# Patient Record
Sex: Female | Born: 1990 | Race: White | Hispanic: No | Marital: Married | Smoking: Never smoker
Health system: Southern US, Community
[De-identification: ages and names within clinical notes are randomized; demographics above are authoritative.]

## PROBLEM LIST (undated history)

## (undated) DIAGNOSIS — Z8619 Personal history of other infectious and parasitic diseases: Secondary | ICD-10-CM

## (undated) DIAGNOSIS — K219 Gastro-esophageal reflux disease without esophagitis: Secondary | ICD-10-CM

## (undated) HISTORY — PX: MANDIBLE SURGERY: SHX707

## (undated) HISTORY — DX: Gastro-esophageal reflux disease without esophagitis: K21.9

## (undated) HISTORY — DX: Personal history of other infectious and parasitic diseases: Z86.19

---

## 2018-04-29 DIAGNOSIS — R7303 Prediabetes: Secondary | ICD-10-CM | POA: Insufficient documentation

## 2018-04-29 DIAGNOSIS — E282 Polycystic ovarian syndrome: Secondary | ICD-10-CM | POA: Insufficient documentation

## 2019-01-07 ENCOUNTER — Encounter: Payer: Self-pay | Admitting: Physician Assistant

## 2019-01-07 ENCOUNTER — Other Ambulatory Visit: Payer: Self-pay

## 2019-01-07 ENCOUNTER — Ambulatory Visit (INDEPENDENT_AMBULATORY_CARE_PROVIDER_SITE_OTHER): Payer: BLUE CROSS/BLUE SHIELD | Admitting: Physician Assistant

## 2019-01-07 DIAGNOSIS — E282 Polycystic ovarian syndrome: Secondary | ICD-10-CM

## 2019-01-07 DIAGNOSIS — M545 Low back pain, unspecified: Secondary | ICD-10-CM

## 2019-01-07 DIAGNOSIS — K219 Gastro-esophageal reflux disease without esophagitis: Secondary | ICD-10-CM | POA: Insufficient documentation

## 2019-01-07 DIAGNOSIS — N926 Irregular menstruation, unspecified: Secondary | ICD-10-CM | POA: Insufficient documentation

## 2019-01-07 DIAGNOSIS — Z975 Presence of (intrauterine) contraceptive device: Secondary | ICD-10-CM | POA: Insufficient documentation

## 2019-01-07 MED ORDER — CYCLOBENZAPRINE HCL 10 MG PO TABS: 10.0000 mg | ORAL_TABLET | Freq: Every day | ORAL | 0 refills | Status: DC

## 2019-01-07 MED ORDER — MELOXICAM 15 MG PO TABS: 15.0000 mg | ORAL_TABLET | Freq: Every day | ORAL | 0 refills | Status: DC

## 2019-01-07 NOTE — Progress Notes (Signed)
   Virtual Visit via Video   I connected with patient on 01/07/19 at  2:00 PM EDT by a video enabled telemedicine application and verified that I am speaking with the correct person using two identifiers.  Location patient: Home Location provider: [facility], Office Persons participating in the virtual visit: Patient, Provider, [place] (Patina Moore)  I discussed the limitations of evaluation and management by telemedicine and the availability of in person appointments. The patient expressed understanding and agreed to proceed.  Subjective:   HPI:   Patient presents today via Doxy.Me today to establish care.   History of PCOS, currently followed by [facility]. Is not currently on Metofrmin. Notes he was taking before but no change in A1C and had side effects to this was stopped.Marland Kitchen   GERD -- OTC H2 blocker PRN for occasional symptoms. Avoid trigger foods. Denies abdominal pain, nausea or vomiting. Denies change in bowel habits.  Patient endorses over the past 4-5 weeks having bilateral lumbar back pain described as an aching and tightness. R?L. Denies radiation of pain. Denies trauma or injury. Denies numbness, tingling or weakness of extremities. Denies saddle paresthesias. ROM worsens. Laying improves. Prolonged sitting exacerbates. Takes Ibuprofen here and there with some improvement. Massage helps. Patient with morbid obesity.  ROS:  Review of Systems  Constitutional: Negative for fever and weight loss.  Cardiovascular: Negative for chest pain and palpitations.  Gastrointestinal: Positive for heartburn. Negative for abdominal pain, blood in stool, constipation, diarrhea, melena, nausea and vomiting.  Genitourinary: Negative.   Musculoskeletal: Positive for back pain. Negative for falls, myalgias and neck pain.  Neurological: Negative for dizziness, loss of consciousness and headaches.  Psychiatric/Behavioral: Negative for depression. The patient is not  nervous/anxious.      Patient Active Problem List   Diagnosis Date Noted  . IUD contraception 01/07/2019  . Irregular periods 01/07/2019  . Polycystic ovary syndrome 04/29/2018  . Prediabetes 04/29/2018    Social History   Tobacco Use  . Smoking status: Never Smoker  . Smokeless tobacco: Never Used  Substance Use Topics  . Alcohol use: Not Currently    Current Outpatient Medications:  .  amoxicillin-clavulanate (AUGMENTIN) 875-125 MG tablet, , Disp: , Rfl:  .  cimetidine (TAGAMET) 200 MG tablet, Take 200 mg by mouth 2 (two) times daily., Disp: , Rfl:  .  fluconazole (DIFLUCAN) 150 MG tablet, , Disp: , Rfl:  .  levonorgestrel (MIRENA, 52 MG,) 20 MCG/24HR IUD, Mirena 20 mcg/24 hours (5 yrs) 52 mg intrauterine device, Disp: , Rfl:   Allergies  Allergen Reactions  . Latex Itching    Objective:   There were no vitals taken for this visit.  Patient is well-developed, well-nourished in no acute distress.  Resting comfortably in chair at home.  Head is normocephalic, atraumatic.  No labored breathing.  Speech is clear and coherent with logical contest.  Patient is alert and oriented at baseline.   Assessment and Plan:   1. Acute bilateral low back pain without sciatica Atraumatic. No alarm signs/symptoms. Start Flexeril at night. Rx Meloxicam once daily with food. Tylenol for breakthrough pain. Continue massages. If not improving over the next week will proceed with imaging and in-office assessment.   2. Polycystic ovary syndrome Continue Management per GYN. Is to schedule CPE with fasting labs so A1C can be reassessed.   3. Gastroesophageal reflux disease without esophagitis Occasional symptoms. Controlled with PRN H2 blocker. GERD diet reviewed. Will monitor.   Leeanne Rio, PA-C 01/07/2019

## 2019-01-07 NOTE — Progress Notes (Signed)
I have discussed the procedure for the virtual visit with the patient who has given consent to proceed with assessment and treatment.   Tyreese Thain S Abbegayle Denault, CMA     

## 2019-01-09 ENCOUNTER — Encounter: Payer: Self-pay | Admitting: Physician Assistant

## 2019-01-09 MED ORDER — FLUCONAZOLE 150 MG PO TABS: 150.0000 mg | ORAL_TABLET | Freq: Once | ORAL | 0 refills | Status: AC

## 2019-01-20 ENCOUNTER — Encounter: Payer: Self-pay | Admitting: Physician Assistant

## 2019-03-17 ENCOUNTER — Encounter: Payer: Self-pay | Admitting: Physician Assistant

## 2019-03-17 ENCOUNTER — Ambulatory Visit (INDEPENDENT_AMBULATORY_CARE_PROVIDER_SITE_OTHER): Payer: BLUE CROSS/BLUE SHIELD

## 2019-03-17 ENCOUNTER — Other Ambulatory Visit: Payer: Self-pay

## 2019-03-17 ENCOUNTER — Other Ambulatory Visit: Payer: BLUE CROSS/BLUE SHIELD

## 2019-03-17 ENCOUNTER — Ambulatory Visit: Payer: BLUE CROSS/BLUE SHIELD | Admitting: Physician Assistant

## 2019-03-17 VITALS — BP 124/84 | HR 83 | Temp 98.1°F | Resp 16 | Ht 66.0 in | Wt 265.0 lb

## 2019-03-17 DIAGNOSIS — Z23 Encounter for immunization: Secondary | ICD-10-CM | POA: Diagnosis not present

## 2019-03-17 DIAGNOSIS — G8929 Other chronic pain: Secondary | ICD-10-CM

## 2019-03-17 DIAGNOSIS — M545 Low back pain, unspecified: Secondary | ICD-10-CM

## 2019-03-17 DIAGNOSIS — M109 Gout, unspecified: Secondary | ICD-10-CM | POA: Diagnosis not present

## 2019-03-17 LAB — URIC ACID: Uric Acid, Serum: 7.5 mg/dL — ABNORMAL HIGH (ref 2.4–7.0)

## 2019-03-17 NOTE — Progress Notes (Signed)
Patient presents to clinic today to discuss 2 issues.  Patient endorses pain, redness and swelling in her L great toe, lasting for 3-4 days before subsiding. Denies trauma or injury to foot. Denied numbness or tingling. Notes pain with trying to walk on the toe. Denies decreased ROM. Denies personal history of gout but notes her father has had significant issue with this and told her that her toe looked like his get during a flare. Denies alcohol use. Does have heavy purine diet.   Patient also with intermittent bilateral low back pain starting in 2016. Atraumatic. Saw chiropractor at the time which gave her some relief. Notes a few years without symptoms but since March has been having flare ups. Rare radiation of pain down her left leg. None currently. Pain worse with prolonged sitting. Denies saddle anesthesia, change to bowel or bladder habits. Was seen at her initial visit for acute low back pain, givien Meloxicam and Flexeril with resolution in symptoms but they keep recurring. Denies ever having imaging of her lower back.  Past Medical History:  Diagnosis Date  . GERD (gastroesophageal reflux disease)   . History of chickenpox     Current Outpatient Medications on File Prior to Visit  Medication Sig Dispense Refill  . cetirizine (ZYRTEC) 10 MG tablet Take 10 mg by mouth daily.    . cyclobenzaprine (FLEXERIL) 10 MG tablet Take 1 tablet (10 mg total) by mouth at bedtime. 15 tablet 0  . levonorgestrel (MIRENA, 52 MG,) 20 MCG/24HR IUD Mirena 20 mcg/24 hours (5 yrs) 52 mg intrauterine device    . meloxicam (MOBIC) 15 MG tablet Take 1 tablet (15 mg total) by mouth daily. 20 tablet 0  . pantoprazole (PROTONIX) 40 MG tablet Take 40 mg by mouth daily.     No current facility-administered medications on file prior to visit.     Allergies  Allergen Reactions  . Latex Itching    Family History  Problem Relation Age of Onset  . Fibromyalgia Mother   . Arthritis Mother   . Depression  Mother   . Anxiety disorder Mother   . Hyperlipidemia Father   . Mental illness Sister   . Osteoporosis Maternal Grandmother   . Heart disease Paternal Grandfather   . Heart attack Paternal Grandfather   . Lung cancer Maternal Aunt     Social History   Socioeconomic History  . Marital status: Married    Spouse name: Not on file  . Number of children: Not on file  . Years of education: Not on file  . Highest education level: Not on file  Occupational History  . Not on file  Social Needs  . Financial resource strain: Not on file  . Food insecurity    Worry: Not on file    Inability: Not on file  . Transportation needs    Medical: Not on file    Non-medical: Not on file  Tobacco Use  . Smoking status: Never Smoker  . Smokeless tobacco: Never Used  Substance and Sexual Activity  . Alcohol use: Not Currently  . Drug use: Not Currently  . Sexual activity: Yes    Birth control/protection: I.U.D.  Lifestyle  . Physical activity    Days per week: Not on file    Minutes per session: Not on file  . Stress: Not on file  Relationships  . Social Herbalist on phone: Not on file    Gets together: Not on file    Attends  religious service: Not on file    Active member of club or organization: Not on file    Attends meetings of clubs or organizations: Not on file    Relationship status: Not on file  Other Topics Concern  . Not on file  Social History Narrative  . Not on file   Review of Systems - See HPI.  All other ROS are negative.  BP 124/84   Pulse 83   Temp 98.1 F (36.7 C) (Skin)   Resp 16   Ht 5\' 6"  (1.676 m)   Wt 265 lb (120.2 kg)   SpO2 98%   BMI 42.77 kg/m   Physical Exam Vitals signs reviewed.  Constitutional:      Appearance: Normal appearance.  HENT:     Head: Normocephalic and atraumatic.  Neck:     Musculoskeletal: Neck supple.  Cardiovascular:     Rate and Rhythm: Normal rate and regular rhythm.     Pulses: Normal pulses.     Heart  sounds: Normal heart sounds.  Pulmonary:     Effort: Pulmonary effort is normal.  Musculoskeletal:     Right hip: Normal.     Left hip: Normal.     Thoracic back: Normal.     Lumbar back: Normal.       Feet:  Neurological:     General: No focal deficit present.     Mental Status: She is alert and oriented to person, place, and time.  Psychiatric:        Mood and Affect: Mood normal.     Assessment/Plan: 1. Podagra Resolved but giving history given, concern for gout. Dietary precautions reviewed with patient. Handout given. Will check uric acid level today. - Uric acid  2. Chronic bilateral low back pain without sciatica Ongoing and intermittent but increasing in frequency. Exam unremarkable today. Stretching reviewed. Will check x-ray lumbar spine to further assess may concern PT versus further imaging. Discussed diet for weight loss to help with this.  - DG Lumbar Spine Complete; Future  3. Need for immunization against influenza - Flu Vaccine QUAD 36+ mos IM   , PA-C

## 2019-03-17 NOTE — Patient Instructions (Signed)
Please speak with [name] up front to schedule an appointment to get your x-ray.  Please go to the [facility] office for x-ray. We will call you with your results and alter treatment accordingly.   Joanna Lindsey  [name], [place] [address]  I have refilled the meloxicam and flexeril to have on hand if needed for now.  Once we get results of imaging we will determine next steps.  For the suspected gout, please follow dietary recommendations below. We are checking uric acid levels today.    Gout  Gout is painful swelling of your joints. Gout is a type of arthritis. It is caused by having too much uric acid in your body. Uric acid is a chemical that is made when your body breaks down substances called purines. If your body has too much uric acid, sharp crystals can form and build up in your joints. This causes pain and swelling. Gout attacks can happen quickly and be very painful (acute gout). Over time, the attacks can affect more joints and happen more often (chronic gout). What are the causes?  Too much uric acid in your blood. This can happen because: ? Your kidneys do not remove enough uric acid from your blood. ? Your body makes too much uric acid. ? You eat too many foods that are high in purines. These foods include organ meats, some seafood, and beer.  Trauma or stress. What increases the risk?  Having a family history of gout.  Being female and middle-aged.  Being female and having gone through menopause.  Being very overweight (obese).  Drinking alcohol, especially beer.  Not having enough water in the body (being dehydrated).  Losing weight too quickly.  Having an organ transplant.  Having lead poisoning.  Taking certain medicines.  Having kidney disease.  Having a skin condition called psoriasis. What are the signs or symptoms? An attack of acute gout usually happens in just one joint. The most common place is the big toe.  Attacks often start at night. Other joints that may be affected include joints of the feet, ankle, knee, fingers, wrist, or elbow. Symptoms of an attack may include:  Very bad pain.  Warmth.  Swelling.  Stiffness.  Shiny, red, or purple skin.  Tenderness. The affected joint may be very painful to touch.  Chills and fever. Chronic gout may cause symptoms more often. More joints may be involved. You may also have white or yellow lumps (tophi) on your hands or feet or in other areas near your joints. How is this treated?  Treatment for this condition has two phases: treating an acute attack and preventing future attacks.  Acute gout treatment may include: ? NSAIDs. ? Steroids. These are taken by mouth or injected into a joint. ? Colchicine. This medicine relieves pain and swelling. It can be given by mouth or through an IV tube.  Preventive treatment may include: ? Taking small doses of NSAIDs or colchicine daily. ? Using a medicine that reduces uric acid levels in your blood. ? Making changes to your diet. You may need to see a food expert (dietitian) about what to eat and drink to prevent gout. Follow these instructions at home: During a gout attack   If told, put ice on the painful area: ? Put ice in a plastic bag. ? Place a towel between your skin and the bag. ? Leave the ice on for 20 minutes, 2-3 times a day.  Raise (elevate) the painful  joint above the level of your heart as often as you can.  Rest the joint as much as possible. If the joint is in your leg, you may be given crutches.  Follow instructions from your doctor about what you cannot eat or drink. Avoiding future gout attacks  Eat a low-purine diet. Avoid foods and drinks such as: ? Liver. ? Kidney. ? Anchovies. ? Asparagus. ? Herring. ? Mushrooms. ? Mussels. ? Beer.  Stay at a healthy weight. If you want to lose weight, talk with your doctor. Do not lose weight too fast.  Start or continue an  exercise plan as told by your doctor. Eating and drinking  Drink enough fluids to keep your pee (urine) pale yellow.  If you drink alcohol: ? Limit how much you use to:  0-1 drink a day for women.  0-2 drinks a day for men. ? Be aware of how much alcohol is in your drink. In the [place] one drink equals one 12 oz bottle of beer (355 mL), one 5 oz glass of wine (148 mL), or one 1 oz glass of hard liquor (44 mL). General instructions  Take over-the-counter and prescription medicines only as told by your doctor.  Do not drive or use heavy machinery while taking prescription pain medicine.  Return to your normal activities as told by your doctor. Ask your doctor what activities are safe for you.  Keep all follow-up visits as told by your doctor. This is important. Contact a doctor if:  You have another gout attack.  You still have symptoms of a gout attack after 10 days of treatment.  You have problems (side effects) because of your medicines.  You have chills or a fever.  You have burning pain when you pee (urinate).  You have pain in your lower back or belly. Get help right away if:  You have very bad pain.  Your pain cannot be controlled.  You cannot pee. Summary  Gout is painful swelling of the joints.  The most common site of pain is the big toe, but it can affect other joints.  Medicines and avoiding some foods can help to prevent and treat gout attacks. This information is not intended to replace advice given to you by your health care provider. Make sure you discuss any questions you have with your health care provider. Document Released: 03/21/2008 Document Revised: 01/02/2018 Document Reviewed: 01/02/2018 Elsevier Patient Education  2020 [employer].

## 2019-03-18 ENCOUNTER — Other Ambulatory Visit: Payer: Self-pay | Admitting: Emergency Medicine

## 2019-03-18 DIAGNOSIS — G8929 Other chronic pain: Secondary | ICD-10-CM

## 2019-03-19 ENCOUNTER — Other Ambulatory Visit: Payer: Self-pay

## 2019-03-19 DIAGNOSIS — E790 Hyperuricemia without signs of inflammatory arthritis and tophaceous disease: Secondary | ICD-10-CM

## 2019-03-25 ENCOUNTER — Other Ambulatory Visit: Payer: Self-pay

## 2019-03-25 ENCOUNTER — Ambulatory Visit (INDEPENDENT_AMBULATORY_CARE_PROVIDER_SITE_OTHER): Payer: BLUE CROSS/BLUE SHIELD | Admitting: Physical Therapy

## 2019-03-25 ENCOUNTER — Encounter: Payer: Self-pay | Admitting: Physical Therapy

## 2019-03-25 ENCOUNTER — Other Ambulatory Visit: Payer: Self-pay | Admitting: Physician Assistant

## 2019-03-25 DIAGNOSIS — M545 Low back pain, unspecified: Secondary | ICD-10-CM

## 2019-03-25 DIAGNOSIS — G8929 Other chronic pain: Secondary | ICD-10-CM

## 2019-03-25 DIAGNOSIS — M6281 Muscle weakness (generalized): Secondary | ICD-10-CM

## 2019-03-25 NOTE — Patient Instructions (Signed)
Access Code: [email or web address]  URL: [email or web address]  Date: 03/25/2019  Prepared by: Lyndee Hensen   Exercises Single Knee to Chest Stretch - 3 reps - 30 hold - 2x daily Supine Figure 4 Piriformis Stretch - 3 reps - 30 hold - 2x daily Supine Posterior Pelvic Tilt - 10 reps - 2 sets - 2x daily Seated Correct Posture

## 2019-03-26 ENCOUNTER — Encounter: Payer: Self-pay | Admitting: Physical Therapy

## 2019-03-26 ENCOUNTER — Other Ambulatory Visit: Payer: Self-pay | Admitting: Physician Assistant

## 2019-03-26 ENCOUNTER — Encounter: Payer: Self-pay | Admitting: Physician Assistant

## 2019-03-26 DIAGNOSIS — M545 Low back pain, unspecified: Secondary | ICD-10-CM

## 2019-03-26 NOTE — Therapy (Signed)
[facility] [address] [place], [place], [address] Phone: [phone]   Fax:  [phone]  Physical Therapy Evaluation  Patient Details  Name: Joanna [name] MRN: [id number] Date of Birth: November 25, 1990 Referring Provider (PT): Marcelline Mates PA   Encounter Date: 03/25/2019  PT End of Session - 03/26/19 1039    Visit Number  1    Number of Visits  12    Date for PT Re-Evaluation  05/06/19    Authorization Type  [employer]    PT Start Time  1105    PT Stop Time  1145    PT Time Calculation (min)  40 min    Activity Tolerance  Patient tolerated treatment well    Behavior During Therapy  [facility] for tasks assessed/performed       Past Medical History:  Diagnosis Date  . GERD (gastroesophageal reflux disease)   . History of chickenpox     Past Surgical History:  Procedure Laterality Date  . MANDIBLE SURGERY      There were no vitals filed for this visit.   Subjective Assessment - 03/25/19 1110    Subjective  Pt states start of back pain in 2016, from sitting alot in grad school.  More recently in July pain increased.  Most pain with sitting and with prolonged standing, No radicular pain.    Limitations  Sitting;Standing;House hold activities    Patient Stated Goals  Decreased pain,    Currently in Pain?  Yes    Pain Score  8     Pain Location  Back    Pain Orientation  Right;Left    Pain Descriptors / Indicators  Aching    Pain Type  Chronic pain    Pain Radiating Towards  overall 4, up to 8/10 with sitting.    Pain Onset  More than a month ago    Pain Frequency  Intermittent    Aggravating Factors   sitting, pronged standing    Pain Relieving Factors  rest , laying on stomach.         [facility] PT Assessment - 03/26/19 0001      Assessment   Medical Diagnosis  [place] Low Back Pain    Referring Provider (PT)  Marcelline Mates PA    Prior Therapy  NO      Balance Screen   Has the patient fallen in the past 6 months  No       Prior Function   Level of Independence  Independent      Cognition   Overall Cognitive Status  Within Functional Limits for tasks assessed      Posture/Postural Control   Posture Comments  flattened thoracic and lumbar spine      ROM / Strength   AROM / PROM / Strength  AROM;Strength      AROM   Overall AROM Comments  Lumbar: WNL      Strength   Overall Strength Comments  Hips: 4/5, Core: 3/5, moderate/significant postural changes seen with bridge.       Palpation   Palpation comment  Pain at B SI, mild pain into L glute, mild soreness in central lumbar spine with PA mobs,       Special Tests   Other special tests  Neg SLR, No pain with lumbar flexion repeated motions,                 Objective measurements completed on examination: See above findings.  [facility] PT Treatment/Exercise - 03/26/19 0001      Self-Care   Self-Care  Posture    Posture  Education on proper seated posture and neutral pelvic/lumbar alignment.       Exercises   Exercises  Lumbar      Lumbar Exercises: Stretches   Single Knee to Chest Stretch  3 reps;20 seconds;Right;Left    Pelvic Tilt  20 reps    Piriformis Stretch  2 reps;30 seconds;Right;Left    Piriformis Stretch Limitations  supine             PT Education - 03/26/19 1039    Education Details  PT POC, initial HEP    Person(s) Educated  Patient    Methods  Explanation;Demonstration;Tactile cues;Verbal cues;Handout    Comprehension  Verbalized understanding;Returned demonstration;Verbal cues required;Tactile cues required;Need further instruction       PT Short Term Goals - 03/26/19 1042      PT SHORT TERM GOAL #1   Title  Pt to be independent with initial HEP    Time  2    Period  Weeks    Status  New    Target Date  04/08/19      PT SHORT TERM GOAL #2   Title  Pt to demo ability to achieve optimal upright, seated posture.    Time  2    Period  Weeks    Status  New    Target Date  04/08/19         PT Long Term Goals - 03/26/19 1046      PT LONG TERM GOAL #1   Title  Pt to be independent with final HEP    Time  6    Period  Weeks    Status  New    Target Date  05/06/19      PT LONG TERM GOAL #2   Title  Pt to demo improved strength of core and hips, to at least 4+/5, with minimal/no postural changes seen with ther ex. , to improve stability and pain.    Time  6    Period  Weeks    Status  New    Target Date  05/06/19      PT LONG TERM GOAL #3   Title  Pt to report ability for sitting up to 30 min without pain, to improve ability for work duties.    Time  6    Period  Weeks    Status  New    Target Date  05/06/19      PT LONG TERM GOAL #4   Title  Pt to report ability for walking program, for at least .5 mile, 2-3 x/wk for improved physical activity, weight loss, and back pain.    Time  6    Period  Weeks    Status  New    Target Date  05/06/19             Plan - 03/26/19 1057    Clinical Impression Statement  Pt presents with primary complaint of increased pain in low back. Pt with most pain in seated, but has good lumbar flexion and ROM. She has decreased seated posture/alignment, and will benefit from education on this. Pt with decreased core strength, and overall deconditioned. Pain likely stemming from poor posture and core weakness. Pt with decreased ability for full functional activities , work duties and community activities due to pain. Pt to benefit from skiled PT to improve pain  and deficits.    Examination-Activity Limitations  Sit;Sleep;Stand;Locomotion Level    Examination-Participation Restrictions  Cleaning;Shop;Community Activity;Driving;Laundry    Stability/Clinical Decision Making  Stable/Uncomplicated    Clinical Decision Making  Low    Rehab Potential  Good    PT Frequency  2x / week    PT Duration  6 weeks    PT Treatment/Interventions  ADLs/Self Care Home Management;Cryotherapy;Electrical Stimulation;Iontophoresis 4mg /ml  Dexamethasone;Moist Heat;Therapeutic exercise;Therapeutic activities;Functional mobility training;Ultrasound;Neuromuscular re-education;Patient/family education;Orthotic Fit/Training;Manual techniques;Taping;Dry needling;Passive range of motion;Spinal Manipulations    PT Next Visit Plan  1-2x/wk for 6 weeks.    Consulted and Agree with Plan of Care  Patient       Patient will benefit from skilled therapeutic intervention in order to improve the following deficits and impairments:  Decreased endurance, Pain, Decreased strength, Decreased activity tolerance, Decreased mobility, Increased muscle spasms, Decreased range of motion, Improper body mechanics, Postural dysfunction  Visit Diagnosis: Chronic bilateral low back pain without sciatica  Muscle weakness (generalized)     Problem List Patient Active Problem List   Diagnosis Date Noted  . IUD contraception 01/07/2019  . Irregular periods 01/07/2019  . Gastroesophageal reflux disease without esophagitis 01/07/2019  . Polycystic ovary syndrome 04/29/2018  . Prediabetes 04/29/2018    Sedalia MutaLauren Adelard Sanon, PT, DPT 11:01 AM  03/26/19    [facility] [address] [place], [place], [address] Phone: [phone]   Fax:  [phone]  Name: Joanna [name] MRN: [id number] Date of Birth: April 23, 1991

## 2019-03-27 ENCOUNTER — Encounter: Payer: Self-pay | Admitting: Physician Assistant

## 2019-03-27 ENCOUNTER — Other Ambulatory Visit: Payer: Self-pay | Admitting: Physician Assistant

## 2019-03-27 DIAGNOSIS — M545 Low back pain, unspecified: Secondary | ICD-10-CM

## 2019-03-27 MED ORDER — MELOXICAM 15 MG PO TABS: 15.0000 mg | ORAL_TABLET | Freq: Every day | ORAL | 0 refills | Status: DC

## 2019-03-31 ENCOUNTER — Ambulatory Visit (INDEPENDENT_AMBULATORY_CARE_PROVIDER_SITE_OTHER): Payer: BLUE CROSS/BLUE SHIELD | Admitting: Physical Therapy

## 2019-03-31 ENCOUNTER — Other Ambulatory Visit: Payer: Self-pay

## 2019-03-31 ENCOUNTER — Encounter: Payer: Self-pay | Admitting: Physical Therapy

## 2019-03-31 DIAGNOSIS — M545 Low back pain, unspecified: Secondary | ICD-10-CM

## 2019-03-31 DIAGNOSIS — G8929 Other chronic pain: Secondary | ICD-10-CM | POA: Diagnosis not present

## 2019-03-31 DIAGNOSIS — M6281 Muscle weakness (generalized): Secondary | ICD-10-CM | POA: Diagnosis not present

## 2019-03-31 NOTE — Therapy (Signed)
[facility] [address] [place], [place], [address] Phone: [phone]   Fax:  [phone]  Physical Therapy Treatment  Patient Details  Name: Joanna [name] MRN: [id number] Date of Birth: 1990/08/06 Referring Provider (PT): Marcelline Mates PA   Encounter Date: 03/31/2019  PT End of Session - 03/31/19 1023    Visit Number  2    Number of Visits  12    Date for PT Re-Evaluation  05/06/19    Authorization Type  BCBS    PT Start Time  1018    PT Stop Time  1057    PT Time Calculation (min)  39 min    Activity Tolerance  Patient tolerated treatment well    Behavior During Therapy  [facility] for tasks assessed/performed       Past Medical History:  Diagnosis Date  . GERD (gastroesophageal reflux disease)   . History of chickenpox     Past Surgical History:  Procedure Laterality Date  . MANDIBLE SURGERY      There were no vitals filed for this visit.  Subjective Assessment - 03/31/19 1022    Subjective  Pt states mild pain today. Was able to do HEP, has questions about HEP.    Patient Stated Goals  Decreased pain,    Currently in Pain?  Yes    Pain Score  4     Pain Location  Back    Pain Orientation  Right;Left    Pain Descriptors / Indicators  Aching    Pain Type  Chronic pain    Pain Onset  More than a month ago    Pain Frequency  Intermittent                       [facility] Adult PT Treatment/Exercise - 03/31/19 1023      Posture/Postural Control   Posture Comments  flattened thoracic and lumbar spine      Self-Care   Self-Care  Posture    Posture  Education on proper seated posture and neutral pelvic/lumbar alignment.       Exercises   Exercises  Lumbar      Lumbar Exercises: Stretches   Single Knee to Chest Stretch  3 reps;20 seconds;Right;Left    Single Knee to Chest Stretch Limitations  with towel     Pelvic Tilt  20 reps    Piriformis Stretch  2 reps;30 seconds;Right;Left    Piriformis  Stretch Limitations  seated and supine      Lumbar Exercises: Aerobic   Stationary Bike  L1 x 5 min       Lumbar Exercises: Standing   Row  20 reps    Theraband Level (Row)  Level 3 (Green)    Other Standing Lumbar Exercises  HIp abd 2x10 bil;      Lumbar Exercises: Supine   Ab Set  15 reps    Bent Knee Raise  15 reps    Bridge  10 reps    Straight Leg Raise  10 reps    Straight Leg Raises Limitations  bil with TA             PT Education - 03/31/19 1022    Education Details  HEP reviewed, updated    Person(s) Educated  Patient    Methods  Explanation;Handout;Demonstration;Tactile cues;Verbal cues    Comprehension  Verbalized understanding;Returned demonstration;Verbal cues required;Tactile cues required;Need further instruction       PT Short Term Goals -  03/26/19 1042      PT SHORT TERM GOAL #1   Title  Pt to be independent with initial HEP    Time  2    Period  Weeks    Status  New    Target Date  04/08/19      PT SHORT TERM GOAL #2   Title  Pt to demo ability to achieve optimal upright, seated posture.    Time  2    Period  Weeks    Status  New    Target Date  04/08/19        PT Long Term Goals - 03/26/19 1046      PT LONG TERM GOAL #1   Title  Pt to be independent with final HEP    Time  6    Period  Weeks    Status  New    Target Date  05/06/19      PT LONG TERM GOAL #2   Title  Pt to demo improved strength of core and hips, to at least 4+/5, with minimal/no postural changes seen with ther ex. , to improve stability and pain.    Time  6    Period  Weeks    Status  New    Target Date  05/06/19      PT LONG TERM GOAL #3   Title  Pt to report ability for sitting up to 30 min without pain, to improve ability for work duties.    Time  6    Period  Weeks    Status  New    Target Date  05/06/19      PT LONG TERM GOAL #4   Title  Pt to report ability for walking program, for at least .5 mile, 2-3 x/wk for improved physical activity, weight  loss, and back pain.    Time  6    Period  Weeks    Status  New    Target Date  05/06/19            Plan - 03/31/19 1058    Clinical Impression Statement  Education and practice on ther ex for stretching and strengthening today, Pt req cuing for core activation, able to achieve contraction. Pt to benefit from continued education and practice on stabilization.    Examination-Activity Limitations  Sit;Sleep;Stand;Locomotion Level    Examination-Participation Restrictions  Cleaning;Shop;Community Activity;Driving;Laundry    Stability/Clinical Decision Making  Stable/Uncomplicated    Rehab Potential  Good    PT Frequency  2x / week    PT Duration  6 weeks    PT Treatment/Interventions  ADLs/Self Care Home Management;Cryotherapy;Electrical Stimulation;Iontophoresis 4mg /ml Dexamethasone;Moist Heat;Therapeutic exercise;Therapeutic activities;Functional mobility training;Ultrasound;Neuromuscular re-education;Patient/family education;Orthotic Fit/Training;Manual techniques;Taping;Dry needling;Passive range of motion;Spinal Manipulations    PT Next Visit Plan  1-2x/wk for 6 weeks.    Consulted and Agree with Plan of Care  Patient       Patient will benefit from skilled therapeutic intervention in order to improve the following deficits and impairments:  Decreased endurance, Pain, Decreased strength, Decreased activity tolerance, Decreased mobility, Increased muscle spasms, Decreased range of motion, Improper body mechanics, Postural dysfunction  Visit Diagnosis: Acute bilateral low back pain without sciatica  Chronic bilateral low back pain without sciatica  Muscle weakness (generalized)     Problem List Patient Active Problem List   Diagnosis Date Noted  . IUD contraception 01/07/2019  . Irregular periods 01/07/2019  . Gastroesophageal reflux disease without esophagitis 01/07/2019  . Polycystic ovary syndrome  04/29/2018  . Prediabetes 04/29/2018    Lyndee Hensen, PT,  DPT 10:59 AM  03/31/19    [facility] [place], [place], [address] Phone: [phone]   Fax:  [phone]  Name: CRISTYN CROSSNO MRN: [id number] Date of Birth: Jul 10, 1990

## 2019-03-31 NOTE — Patient Instructions (Signed)
Access Code: [email or web address]  URL: [email or web address]  Date: 03/31/2019  Prepared by: Lyndee Hensen   Exercises Single Knee to Chest Stretch - 3 reps - 30 hold - 2x daily Supine Figure 4 Piriformis Stretch - 3 reps - 30 hold - 2x daily Supine Lower Trunk Rotation - 10 reps - 1 sets - 5 hold - 1x daily Supine Posterior Pelvic Tilt - 10 reps - 2 sets - 2x daily Seated Piriformis Stretch with Trunk Bend - 3 reps - 30 hold - 2x daily Seated Correct Posture Supine Transversus Abdominis Bracing - Hands on Ground - 10 reps - 1 sets - 2x daily Supine March - 10 reps - 2 sets - 1x daily Straight Leg Raise - 10 reps - 1-2 sets - 1-2x daily Supine Bridge - 10 reps - 1-2 sets - 1-2x daily

## 2019-04-03 ENCOUNTER — Encounter: Payer: Self-pay | Admitting: Physical Therapy

## 2019-04-03 ENCOUNTER — Ambulatory Visit (INDEPENDENT_AMBULATORY_CARE_PROVIDER_SITE_OTHER): Payer: BLUE CROSS/BLUE SHIELD | Admitting: Physical Therapy

## 2019-04-03 ENCOUNTER — Other Ambulatory Visit: Payer: Self-pay

## 2019-04-03 DIAGNOSIS — M6281 Muscle weakness (generalized): Secondary | ICD-10-CM

## 2019-04-03 DIAGNOSIS — M545 Low back pain, unspecified: Secondary | ICD-10-CM

## 2019-04-03 DIAGNOSIS — G8929 Other chronic pain: Secondary | ICD-10-CM

## 2019-04-03 NOTE — Therapy (Signed)
[facility] [address] [place], [place], [address] Phone: [phone]   Fax:  [phone]  Physical Therapy Treatment  Patient Details  Name: Joanna [name] MRN: [id number] Date of Birth: 1991/05/12 Referring Provider (PT): Raiford Noble PA   Encounter Date: 04/03/2019  PT End of Session - 04/03/19 1115    Visit Number  3    Number of Visits  12    Date for PT Re-Evaluation  05/06/19    Authorization Type  BCBS    PT Start Time  1103    PT Stop Time  1144    PT Time Calculation (min)  41 min    Activity Tolerance  Patient tolerated treatment well    Behavior During Therapy  [facility] for tasks assessed/performed       Past Medical History:  Diagnosis Date  . GERD (gastroesophageal reflux disease)   . History of chickenpox     Past Surgical History:  Procedure Laterality Date  . MANDIBLE SURGERY      There were no vitals filed for this visit.  Subjective Assessment - 04/03/19 1114    Subjective  Pt states increased pain when riding 2 hrs in car earlier this week.Pain better today. She did get a lumbar roll for car yesterday .    Currently in Pain?  Yes    Pain Score  3     Pain Location  Back    Pain Orientation  Right;Left    Pain Descriptors / Indicators  Aching                       [facility] Adult PT Treatment/Exercise - 04/03/19 1102      Posture/Postural Control   Posture Comments  flattened thoracic and lumbar spine      Self-Care   Self-Care  Posture    Posture  Education on proper seated posture and neutral pelvic/lumbar alignment.       Exercises   Exercises  Lumbar      Lumbar Exercises: Stretches   Single Knee to Chest Stretch  --    Single Knee to Chest Stretch Limitations  --    Pelvic Tilt  20 reps    Piriformis Stretch  2 reps;30 seconds;Right;Left    Piriformis Stretch Limitations  supine    Other Lumbar Stretch Exercise  childs pose L/R /Center x1 each;       Lumbar  Exercises: Aerobic   Stationary Bike  L1 x 7 min       Lumbar Exercises: Standing   Functional Squats  10 reps    Functional Squats Limitations  with education and practice for correct lifting mechanics for IADLs.     Row  20 reps    Theraband Level (Row)  Level 3 (Green)    Other Standing Lumbar Exercises  HIp abd 2x10 bil;      Lumbar Exercises: Supine   Ab Set  --    Bent Knee Raise  20 reps    Bridge  20 reps    Straight Leg Raise  10 reps    Straight Leg Raises Limitations  bil with TA    Other Supine Lumbar Exercises  Clam/Alt with TA and GTB       Manual Therapy   Manual Therapy  Joint mobilization    Joint Mobilization  PA mobs lumbar spine, no tenderness today  PT Short Term Goals - 03/26/19 1042      PT SHORT TERM GOAL #1   Title  Pt to be independent with initial HEP    Time  2    Period  Weeks    Status  New    Target Date  04/08/19      PT SHORT TERM GOAL #2   Title  Pt to demo ability to achieve optimal upright, seated posture.    Time  2    Period  Weeks    Status  New    Target Date  04/08/19        PT Long Term Goals - 03/26/19 1046      PT LONG TERM GOAL #1   Title  Pt to be independent with final HEP    Time  6    Period  Weeks    Status  New    Target Date  05/06/19      PT LONG TERM GOAL #2   Title  Pt to demo improved strength of core and hips, to at least 4+/5, with minimal/no postural changes seen with ther ex. , to improve stability and pain.    Time  6    Period  Weeks    Status  New    Target Date  05/06/19      PT LONG TERM GOAL #3   Title  Pt to report ability for sitting up to 30 min without pain, to improve ability for work duties.    Time  6    Period  Weeks    Status  New    Target Date  05/06/19      PT LONG TERM GOAL #4   Title  Pt to report ability for walking program, for at least .5 mile, 2-3 x/wk for improved physical activity, weight loss, and back pain.    Time  6    Period  Weeks     Status  New    Target Date  05/06/19            Plan - 04/03/19 1156    Clinical Impression Statement  Pt with no tenderness to palpate lumbar spine or musculature today, improved from Eval. Pt with improving ability for strengthening, has been able to progress ther ex without pain. Pt educated on posture and mechanics for bend/lift and IADLs today. Plan to progress as tolerated.    Examination-Activity Limitations  Sit;Sleep;Stand;Locomotion Level    Examination-Participation Restrictions  Cleaning;Shop;Community Activity;Driving;Laundry    Stability/Clinical Decision Making  Stable/Uncomplicated    Rehab Potential  Good    PT Frequency  2x / week    PT Duration  6 weeks    PT Treatment/Interventions  ADLs/Self Care Home Management;Cryotherapy;Electrical Stimulation;Iontophoresis 4mg /ml Dexamethasone;Moist Heat;Therapeutic exercise;Therapeutic activities;Functional mobility training;Ultrasound;Neuromuscular re-education;Patient/family education;Orthotic Fit/Training;Manual techniques;Taping;Dry needling;Passive range of motion;Spinal Manipulations    PT Next Visit Plan  1-2x/wk for 6 weeks.    Consulted and Agree with Plan of Care  Patient       Patient will benefit from skilled therapeutic intervention in order to improve the following deficits and impairments:  Decreased endurance, Pain, Decreased strength, Decreased activity tolerance, Decreased mobility, Increased muscle spasms, Decreased range of motion, Improper body mechanics, Postural dysfunction  Visit Diagnosis: Chronic bilateral low back pain without sciatica  Muscle weakness (generalized)     Problem List Patient Active Problem List   Diagnosis Date Noted  . IUD contraception 01/07/2019  . Irregular periods 01/07/2019  .  Gastroesophageal reflux disease without esophagitis 01/07/2019  . Polycystic ovary syndrome 04/29/2018  . Prediabetes 04/29/2018    Joanna [name], PT, DPT 11:57 AM  04/03/19    [facility] [address] [place], [place], [address] Phone: [phone]   Fax:  [phone]  Name: Joanna [name] MRN: [id number] Date of Birth: Dec 29, 1990

## 2019-04-14 ENCOUNTER — Other Ambulatory Visit: Payer: Self-pay

## 2019-04-14 ENCOUNTER — Ambulatory Visit (INDEPENDENT_AMBULATORY_CARE_PROVIDER_SITE_OTHER): Payer: BLUE CROSS/BLUE SHIELD | Admitting: Physical Therapy

## 2019-04-14 ENCOUNTER — Encounter: Payer: Self-pay | Admitting: Physical Therapy

## 2019-04-14 DIAGNOSIS — G8929 Other chronic pain: Secondary | ICD-10-CM

## 2019-04-14 DIAGNOSIS — M545 Low back pain: Secondary | ICD-10-CM

## 2019-04-14 DIAGNOSIS — M6281 Muscle weakness (generalized): Secondary | ICD-10-CM

## 2019-04-14 NOTE — Therapy (Signed)
[facility] [address] [place], [place], [address] Phone: [phone]   Fax:  [phone]  Physical Therapy Treatment  Patient Details  Name: Joanna [name] MRN: [id number] Date of Birth: 04-19-1991 Referring Provider (PT): Marcelline Mates PA   Encounter Date: 04/14/2019  PT End of Session - 04/14/19 1113    Visit Number  4    Number of Visits  12    Date for PT Re-Evaluation  05/06/19    Authorization Type  BCBS    PT Start Time  1102    Activity Tolerance  Patient tolerated treatment well    Behavior During Therapy  [facility] for tasks assessed/performed       Past Medical History:  Diagnosis Date  . GERD (gastroesophageal reflux disease)   . History of chickenpox     Past Surgical History:  Procedure Laterality Date  . MANDIBLE SURGERY      There were no vitals filed for this visit.  Subjective Assessment - 04/14/19 1109    Subjective  Pt states doing better. Has changed sleeping position some, and is also using lumbar pillow for driving, both helping.    Limitations  Sitting;Standing;House hold activities    Currently in Pain?  Yes    Pain Score  1     Pain Location  Back    Pain Orientation  Right;Left    Pain Descriptors / Indicators  Aching    Pain Type  Chronic pain    Pain Onset  More than a month ago    Pain Frequency  Intermittent                       [facility] Adult PT Treatment/Exercise - 04/14/19 1113      Posture/Postural Control   Posture Comments  flattened thoracic and lumbar spine      Self-Care   Self-Care  Posture    Posture  Education on proper seated posture and neutral pelvic/lumbar alignment.       Exercises   Exercises  Lumbar      Lumbar Exercises: Stretches   Single Knee to Chest Stretch  3 reps;20 seconds;Right;Left    Single Knee to Chest Stretch Limitations  with towel     Pelvic Tilt  --    Piriformis Stretch  --    Piriformis Stretch Limitations  --    Other  Lumbar Stretch Exercise  childs pose L/R /Center x1 each;       Lumbar Exercises: Aerobic   Stationary Bike  L2 x 8 min;       Lumbar Exercises: Standing   Functional Squats  --    Functional Squats Limitations  --    Row  20 reps    Theraband Level (Row)  Level 3 (Green)    Other Standing Lumbar Exercises  HIp abd 2x10 YTB, bil;      Lumbar Exercises: Supine   Bent Knee Raise  --    Bridge  20 reps    Straight Leg Raise  20 reps    Straight Leg Raises Limitations  bil with TA    Other Supine Lumbar Exercises  Clam/Alt with TA and GTB x20;     Other Supine Lumbar Exercises  modified crunch x15;       Lumbar Exercises: Sidelying   Hip Abduction  Both;10 reps      Manual Therapy   Manual Therapy  Joint mobilization    Joint Mobilization  --  PT Education - 04/14/19 1111    Education Details  HEP reviewed    Person(s) Educated  Patient    Methods  Explanation    Comprehension  Verbalized understanding       PT Short Term Goals - 04/14/19 1123      PT SHORT TERM GOAL #1   Title  Pt to be independent with initial HEP    Time  2    Period  Weeks    Status  Achieved    Target Date  04/08/19      PT SHORT TERM GOAL #2   Title  Pt to demo ability to achieve optimal upright, seated posture.    Time  2    Period  Weeks    Status  Achieved    Target Date  04/08/19        PT Long Term Goals - 03/26/19 1046      PT LONG TERM GOAL #1   Title  Pt to be independent with final HEP    Time  6    Period  Weeks    Status  New    Target Date  05/06/19      PT LONG TERM GOAL #2   Title  Pt to demo improved strength of core and hips, to at least 4+/5, with minimal/no postural changes seen with ther ex. , to improve stability and pain.    Time  6    Period  Weeks    Status  New    Target Date  05/06/19      PT LONG TERM GOAL #3   Title  Pt to report ability for sitting up to 30 min without pain, to improve ability for work duties.    Time  6     Period  Weeks    Status  New    Target Date  05/06/19      PT LONG TERM GOAL #4   Title  Pt to report ability for walking program, for at least .5 mile, 2-3 x/wk for improved physical activity, weight loss, and back pain.    Time  6    Period  Weeks    Status  New    Target Date  05/06/19            Plan - 04/14/19 1152    Clinical Impression Statement  Pt progressing well, decreasing pain. Pt with improved posture and ability to self correct posture with driving and sleeping. Pt to be seen 1x next week, will benefit from 1-2 more sessions, then plan to d/c to HEP.    Examination-Activity Limitations  Sit;Sleep;Stand;Locomotion Level    Examination-Participation Restrictions  Cleaning;Shop;Community Activity;Driving;Laundry    Stability/Clinical Decision Making  Stable/Uncomplicated    Rehab Potential  Good    PT Frequency  2x / week    PT Duration  6 weeks    PT Treatment/Interventions  ADLs/Self Care Home Management;Cryotherapy;Electrical Stimulation;Iontophoresis 4mg /ml Dexamethasone;Moist Heat;Therapeutic exercise;Therapeutic activities;Functional mobility training;Ultrasound;Neuromuscular re-education;Patient/family education;Orthotic Fit/Training;Manual techniques;Taping;Dry needling;Passive range of motion;Spinal Manipulations    PT Next Visit Plan  1-2x/wk for 6 weeks.    Consulted and Agree with Plan of Care  Patient       Patient will benefit from skilled therapeutic intervention in order to improve the following deficits and impairments:  Decreased endurance, Pain, Decreased strength, Decreased activity tolerance, Decreased mobility, Increased muscle spasms, Decreased range of motion, Improper body mechanics, Postural dysfunction  Visit Diagnosis: Chronic bilateral low back pain without  sciatica  Muscle weakness (generalized)     Problem List Patient Active Problem List   Diagnosis Date Noted  . IUD contraception 01/07/2019  . Irregular periods 01/07/2019  .  Gastroesophageal reflux disease without esophagitis 01/07/2019  . Polycystic ovary syndrome 04/29/2018  . Prediabetes 04/29/2018    Lyndee Hensen, PT, DPT 11:53 AM  04/14/19    [facility] [place], [place], [address] Phone: [phone]   Fax:  [phone]  Name: Joanna [name] MRN: [id number] Date of Birth: 04-30-1991

## 2019-04-21 ENCOUNTER — Ambulatory Visit (INDEPENDENT_AMBULATORY_CARE_PROVIDER_SITE_OTHER): Payer: BLUE CROSS/BLUE SHIELD | Admitting: Physical Therapy

## 2019-04-21 ENCOUNTER — Other Ambulatory Visit: Payer: Self-pay

## 2019-04-21 DIAGNOSIS — M545 Low back pain: Secondary | ICD-10-CM | POA: Diagnosis not present

## 2019-04-21 DIAGNOSIS — G8929 Other chronic pain: Secondary | ICD-10-CM

## 2019-04-21 DIAGNOSIS — M6281 Muscle weakness (generalized): Secondary | ICD-10-CM | POA: Diagnosis not present

## 2019-04-23 ENCOUNTER — Other Ambulatory Visit: Payer: Self-pay | Admitting: Physician Assistant

## 2019-04-23 DIAGNOSIS — M545 Low back pain, unspecified: Secondary | ICD-10-CM

## 2019-04-24 ENCOUNTER — Encounter: Payer: Self-pay | Admitting: Physical Therapy

## 2019-04-24 NOTE — Patient Instructions (Signed)
Access Code: [email or web address]  URL: [email or web address]  Date: 04/14/2019  Prepared by: Lyndee Hensen   Exercises Single Knee to Chest Stretch - 3 reps - 30 hold - 2x daily Supine Figure 4 Piriformis Stretch - 3 reps - 30 hold - 2x daily Supine Lower Trunk Rotation - 10 reps - 1 sets - 5 hold - 1x daily Supine Posterior Pelvic Tilt - 10 reps - 2 sets - 2x daily Seated Piriformis Stretch with Trunk Bend - 3 reps - 30 hold - 2x daily Seated Correct Posture Supine Transversus Abdominis Bracing - Hands on Ground - 10 reps - 1 sets - 2x daily Supine March - 10 reps - 2 sets - 1x daily Straight Leg Raise - 10 reps - 1-2 sets - 1-2x daily Supine Bridge - 10 reps - 1-2 sets - 1-2x daily Sidelying Hip Abduction - 10 reps - 2 sets - 1x daily Standing Hip Abduction - 10 reps - 2 sets - 1x daily Scapular Retraction with Resistance - 10 reps - 2 sets - 1x daily

## 2019-04-24 NOTE — Therapy (Addendum)
[facility] [address] [place], [place], [address] Phone: [phone]   Fax:  [phone]  Physical Therapy Treatment  Patient Details  Name: Joanna [name] MRN: [id number] Date of Birth: August 08, 1990 Referring Provider (PT): Raiford Noble PA   Encounter Date: 04/21/2019  PT End of Session - 04/24/19 1131    Visit Number  5    Number of Visits  12    Date for PT Re-Evaluation  05/06/19    Authorization Type  BCBS    PT Start Time  0932    PT Stop Time  1013    PT Time Calculation (min)  41 min    Activity Tolerance  Patient tolerated treatment well    Behavior During Therapy  [facility] for tasks assessed/performed       Past Medical History:  Diagnosis Date  . GERD (gastroesophageal reflux disease)   . History of chickenpox     Past Surgical History:  Procedure Laterality Date  . MANDIBLE SURGERY      There were no vitals filed for this visit.  Subjective Assessment - 04/24/19 1129    Subjective  Pt states minimal pain, doing much better. Has been doing HEP.    Patient Stated Goals  Decreased pain,    Currently in Pain?  No/denies    Pain Score  0-No pain                       [facility] Adult PT Treatment/Exercise - 04/24/19 0001      Self-Care   Self-Care  Posture      Exercises   Exercises  Lumbar      Lumbar Exercises: Stretches   Single Knee to Chest Stretch  20 seconds;Right;Left;2 reps    Pelvic Tilt  20 reps    Pelvic Tilt Limitations  education on correct mechanics, PPT    Piriformis Stretch  2 reps;30 seconds;Right;Left    Piriformis Stretch Limitations  seated    Other Lumbar Stretch Exercise  childs pose L/R /Center x1 each;       Lumbar Exercises: Aerobic   Stationary Bike  L2 x 8 min;       Lumbar Exercises: Standing   Row  20 reps    Theraband Level (Row)  Level 3 (Green)    Other Standing Lumbar Exercises  HIp abd 2x10 YTB, bil;      Lumbar Exercises: Supine   Bridge  --     Bridge with clamshell  15 reps    Straight Leg Raise  20 reps    Straight Leg Raises Limitations  bil with TA    Other Supine Lumbar Exercises  Clam/Alt with TA and GTB x20;     Other Supine Lumbar Exercises  modified crunch x20;       Manual Therapy   Manual Therapy  Joint mobilization             PT Education - 04/24/19 1130    Education Details  Final HEP reviewed in detail    Person(s) Educated  Patient    Methods  Explanation;Demonstration;Handout    Comprehension  Verbalized understanding;Returned demonstration       PT Short Term Goals - 04/14/19 1123      PT SHORT TERM GOAL #1   Title  Pt to be independent with initial HEP    Time  2    Period  Weeks    Status  Achieved    Target  Date  04/08/19      PT SHORT TERM GOAL #2   Title  Pt to demo ability to achieve optimal upright, seated posture.    Time  2    Period  Weeks    Status  Achieved    Target Date  04/08/19        PT Long Term Goals - 04/24/19 1131      PT LONG TERM GOAL #1   Title  Pt to be independent with final HEP    Time  6    Period  Weeks    Status  Achieved      PT LONG TERM GOAL #2   Title  Pt to demo improved strength of core and hips, to at least 4+/5, with minimal/no postural changes seen with ther ex. , to improve stability and pain.    Time  6    Period  Weeks    Status  Achieved      PT LONG TERM GOAL #3   Title  Pt to report ability for sitting up to 30 min without pain, to improve ability for work duties.    Time  6    Period  Weeks    Status  Achieved      PT LONG TERM GOAL #4   Title  Pt to report ability for walking program, for at least .5 mile, 2-3 x/wk for improved physical activity, weight loss, and back pain.    Time  6    Period  Weeks    Status  Achieved            Plan - 04/24/19 1133    Clinical Impression Statement  Pt making good progress. She has had much improvment of pain, strength, and postural awareness. No further deficits at this time. Pt  has met goals at this time, ready for d/c to HEP. Will hold 2 weeks, then d/c unless pt has difficulty or questions. Pt in agreement with plan.    Examination-Activity Limitations  Sit;Sleep;Stand;Locomotion Level    Examination-Participation Restrictions  Cleaning;Shop;Community Activity;Driving;Laundry    Stability/Clinical Decision Making  Stable/Uncomplicated    Rehab Potential  Good    PT Frequency  2x / week    PT Duration  6 weeks    PT Treatment/Interventions  ADLs/Self Care Home Management;Cryotherapy;Electrical Stimulation;Iontophoresis 96m/ml Dexamethasone;Moist Heat;Therapeutic exercise;Therapeutic activities;Functional mobility training;Ultrasound;Neuromuscular re-education;Patient/family education;Orthotic Fit/Training;Manual techniques;Taping;Dry needling;Passive range of motion;Spinal Manipulations    PT Next Visit Plan  1-2x/wk for 6 weeks.    Consulted and Agree with Plan of Care  Patient       Patient will benefit from skilled therapeutic intervention in order to improve the following deficits and impairments:  Decreased endurance, Pain, Decreased strength, Decreased activity tolerance, Decreased mobility, Increased muscle spasms, Decreased range of motion, Improper body mechanics, Postural dysfunction  Visit Diagnosis: Chronic bilateral low back pain without sciatica  Muscle weakness (generalized)     Problem List Patient Active Problem List   Diagnosis Date Noted  . IUD contraception 01/07/2019  . Irregular periods 01/07/2019  . Gastroesophageal reflux disease without esophagitis 01/07/2019  . Polycystic ovary syndrome 04/29/2018  . Prediabetes 04/29/2018    LLyndee Hensen PT, DPT 11:34 AM  04/24/19    [facility] [address]Phone: [phone]  Fax:  [phone] Name: Joanna BUCKALEWMRN: [id number] of Birth: [birth year]  PHYSICAL THERAPY DISCHARGE SUMMARY  Visits  from Start of  Care: 5  Plan: Patient agrees to discharge.  Patient goals were met. Patient is being discharged due to meeting the stated rehab goals.  ?????     Lyndee Hensen, PT, DPT 11:52 AM  07/09/19

## 2019-05-08 ENCOUNTER — Encounter: Payer: Self-pay | Admitting: Physician Assistant

## 2019-05-09 ENCOUNTER — Encounter: Payer: Self-pay | Admitting: Physician Assistant

## 2019-05-19 ENCOUNTER — Ambulatory Visit (INDEPENDENT_AMBULATORY_CARE_PROVIDER_SITE_OTHER): Payer: BLUE CROSS/BLUE SHIELD

## 2019-05-19 ENCOUNTER — Other Ambulatory Visit: Payer: Self-pay

## 2019-05-19 DIAGNOSIS — E790 Hyperuricemia without signs of inflammatory arthritis and tophaceous disease: Secondary | ICD-10-CM

## 2019-05-19 LAB — URIC ACID: Uric Acid, Serum: 7.9 mg/dL — ABNORMAL HIGH (ref 2.4–7.0)

## 2019-05-20 ENCOUNTER — Other Ambulatory Visit: Payer: Self-pay

## 2019-05-20 DIAGNOSIS — E790 Hyperuricemia without signs of inflammatory arthritis and tophaceous disease: Secondary | ICD-10-CM

## 2019-05-20 MED ORDER — ALLOPURINOL 100 MG PO TABS: 100.0000 mg | ORAL_TABLET | Freq: Every day | ORAL | 2 refills | Status: DC

## 2019-06-02 ENCOUNTER — Other Ambulatory Visit: Payer: Self-pay | Admitting: Physician Assistant

## 2019-06-02 ENCOUNTER — Encounter: Payer: Self-pay | Admitting: Physician Assistant

## 2019-06-02 MED ORDER — PANTOPRAZOLE SODIUM 40 MG PO TBEC: 40.0000 mg | DELAYED_RELEASE_TABLET | Freq: Every day | ORAL | 1 refills | Status: DC

## 2019-06-27 ENCOUNTER — Other Ambulatory Visit: Payer: Self-pay | Admitting: Physician Assistant

## 2019-07-18 ENCOUNTER — Other Ambulatory Visit: Payer: Self-pay

## 2019-07-21 ENCOUNTER — Ambulatory Visit: Payer: BC Managed Care – PPO

## 2019-07-24 DIAGNOSIS — Z20828 Contact with and (suspected) exposure to other viral communicable diseases: Secondary | ICD-10-CM | POA: Diagnosis not present

## 2019-07-29 ENCOUNTER — Other Ambulatory Visit: Payer: Self-pay | Admitting: Physician Assistant

## 2019-08-08 ENCOUNTER — Other Ambulatory Visit: Payer: Self-pay

## 2019-08-08 ENCOUNTER — Ambulatory Visit (INDEPENDENT_AMBULATORY_CARE_PROVIDER_SITE_OTHER): Payer: BC Managed Care – PPO | Admitting: *Deleted

## 2019-08-08 DIAGNOSIS — E790 Hyperuricemia without signs of inflammatory arthritis and tophaceous disease: Secondary | ICD-10-CM | POA: Diagnosis not present

## 2019-08-08 LAB — COMPREHENSIVE METABOLIC PANEL
ALT: 28 U/L (ref 0–35)
AST: 21 U/L (ref 0–37)
Albumin: 4.2 g/dL (ref 3.5–5.2)
Alkaline Phosphatase: 75 U/L (ref 39–117)
BUN: 13 mg/dL (ref 6–23)
CO2: 29 mEq/L (ref 19–32)
Calcium: 9.1 mg/dL (ref 8.4–10.5)
Chloride: 105 mEq/L (ref 96–112)
Creatinine, Ser: 0.88 mg/dL (ref 0.40–1.20)
GFR: 75.09 mL/min (ref >60.00)
Glucose, Bld: 87 mg/dL (ref 70–99)
Potassium: 3.8 mEq/L (ref 3.5–5.1)
Sodium: 142 mEq/L (ref 135–145)
Total Bilirubin: 0.8 mg/dL (ref 0.2–1.2)
Total Protein: 7.4 g/dL (ref 6.0–8.3)

## 2019-08-08 LAB — URIC ACID: Uric Acid, Serum: 7.3 mg/dL — ABNORMAL HIGH (ref 2.4–7.0)

## 2019-08-28 ENCOUNTER — Encounter: Payer: Self-pay | Admitting: Physician Assistant

## 2019-08-28 ENCOUNTER — Other Ambulatory Visit: Payer: Self-pay | Admitting: Emergency Medicine

## 2019-08-28 DIAGNOSIS — E790 Hyperuricemia without signs of inflammatory arthritis and tophaceous disease: Secondary | ICD-10-CM

## 2019-08-28 MED ORDER — ALLOPURINOL 100 MG PO TABS: 100.0000 mg | ORAL_TABLET | Freq: Every day | ORAL | 1 refills | Status: DC

## 2019-09-11 ENCOUNTER — Ambulatory Visit: Payer: BC Managed Care – PPO | Attending: Internal Medicine

## 2019-09-11 DIAGNOSIS — Z23 Encounter for immunization: Secondary | ICD-10-CM

## 2019-09-11 NOTE — Progress Notes (Signed)
   Covid-19 Vaccination Clinic  Name:  KRYSTEL FLETCHALL    MRN: [id number] DOB: 1991-01-25  09/11/2019  Ms. Culbertson was observed post Covid-19 immunization for 15 minutes without incident. She was provided with Vaccine Information Sheet and instruction to access the V-Safe system.   Ms. Ellington was instructed to call 911 with any severe reactions post vaccine: Marland Kitchen Difficulty breathing  . Swelling of face and throat  . A fast heartbeat  . A bad rash all over body  . Dizziness and weakness   Immunizations Administered    Name Date Dose VIS Date Route   Pfizer COVID-19 Vaccine 09/11/2019 12:36 PM 0.3 mL 06/06/2019 Intramuscular   Manufacturer: [employer], [employer]   Lot: [id number]   NDC: [address]-6

## 2019-09-23 ENCOUNTER — Encounter: Payer: Self-pay | Admitting: Physician Assistant

## 2019-09-25 ENCOUNTER — Other Ambulatory Visit: Payer: Self-pay

## 2019-09-25 ENCOUNTER — Encounter: Payer: Self-pay | Admitting: Physician Assistant

## 2019-09-25 ENCOUNTER — Telehealth (INDEPENDENT_AMBULATORY_CARE_PROVIDER_SITE_OTHER): Payer: BC Managed Care – PPO | Admitting: Physician Assistant

## 2019-09-25 DIAGNOSIS — J310 Chronic rhinitis: Secondary | ICD-10-CM | POA: Diagnosis not present

## 2019-09-25 DIAGNOSIS — L710 Perioral dermatitis: Secondary | ICD-10-CM

## 2019-09-25 MED ORDER — ERYTHROMYCIN 2 % EX GEL: Freq: Two times a day (BID) | CUTANEOUS | 0 refills | Status: DC

## 2019-09-25 NOTE — Progress Notes (Signed)
   Virtual Visit via Video   I connected with patient on 09/25/19 at 11:30 AM EDT by a video enabled telemedicine application and verified that I am speaking with the correct person using two identifiers.  Location patient: Home Location provider: [facility], [facility] Persons participating in the virtual visit: Patient, Provider, CMA (Patina Moore)  I discussed the limitations of evaluation and management by telemedicine and the availability of in person appointments. The patient expressed understanding and agreed to proceed.  Subjective:   HPI:   Patient presents via Caregility today to discuss further treatment for perioral dermatitis. Patient diagnosed in December by her Dermatologist. Was started on Metrogel with only some improvement noted. States she read that topical steroids can exacerbate symptoms. Is taking Flonase for allergies and is concerned it may be contributing to residual symptoms. As such has stopped the medication. Would like to discuss other options for allergy symptoms and to resolve the remaining dermatitis.   ROS:   See pertinent positives and negatives per HPI.  Patient Active Problem List   Diagnosis Date Noted  . IUD contraception 01/07/2019  . Irregular periods 01/07/2019  . Gastroesophageal reflux disease without esophagitis 01/07/2019  . Polycystic ovary syndrome 04/29/2018  . Prediabetes 04/29/2018    Social History   Tobacco Use  . Smoking status: Never Smoker  . Smokeless tobacco: Never Used  Substance Use Topics  . Alcohol use: Not Currently    Current Outpatient Medications:  .  allopurinol (ZYLOPRIM) 100 MG tablet, Take 1 tablet (100 mg total) by mouth daily., Disp: 90 tablet, Rfl: 1 .  cetirizine (ZYRTEC) 10 MG tablet, Take 10 mg by mouth daily., Disp: , Rfl:  .  levonorgestrel (MIRENA, 52 MG,) 20 MCG/24HR IUD, Mirena 20 mcg/24 hours (5 yrs) 52 mg intrauterine device, Disp: , Rfl:  .  pantoprazole (PROTONIX) 40 MG tablet, TAKE 1  TABLET BY MOUTH EVERY DAY, Disp: 90 tablet, Rfl: 1  Allergies  Allergen Reactions  . Latex Itching    Objective:   There were no vitals taken for this visit.  Patient is well-developed, well-nourished in no acute distress.  Resting comfortably at home.  Head is normocephalic, atraumatic.  No labored breathing.  Speech is clear and coherent with logical content.  Patient is alert and oriented at baseline.  Erythematous papular rash noted around the mouth and nasolabial folds. Does not touch the vermillon border.   Assessment and Plan:   1. Perioral dermatitis Recurrent. Do feel that nasal steroid contributing. She has stopped this. Discuss options for treatment including continuation of the Metrogel, Erythromycin gel and oral Doxcycline. She would like to avoid oral antibiotics if possible due to significant hx of yeast vaginitis 2/2 abx use. Will start topical erythromycin BID. Skin care reviewed. Follow-up 2-3 weeks via MyChart to let us know how things are going.  2. Chronic rhinitis Continue antihistamine. Start trial of Astelin. Follow-up prn.    Piedad Climes, PA-C 09/25/2019

## 2019-09-25 NOTE — Progress Notes (Signed)
I have discussed the procedure for the virtual visit with the patient who has given consent to proceed with assessment and treatment.   Mayfield Schoene S Cordaro Mukai, CMA     

## 2019-09-26 MED ORDER — AZELASTINE HCL 0.1 % NA SOLN: 1.0000 | Freq: Two times a day (BID) | NASAL | 2 refills | Status: DC

## 2019-09-29 ENCOUNTER — Ambulatory Visit: Payer: BC Managed Care – PPO | Admitting: Physician Assistant

## 2019-10-01 ENCOUNTER — Encounter: Payer: Self-pay | Admitting: Physician Assistant

## 2019-10-06 ENCOUNTER — Ambulatory Visit: Payer: BC Managed Care – PPO | Attending: Internal Medicine

## 2019-10-06 DIAGNOSIS — Z23 Encounter for immunization: Secondary | ICD-10-CM

## 2019-10-06 NOTE — Progress Notes (Signed)
   Covid-19 Vaccination Clinic  Name:  Joanna [name]    MRN: [id number] DOB: 03-Aug-1990  10/06/2019  Joanna [name] was observed post Covid-19 immunization for 15 minutes without incident. She was provided with Vaccine Information Sheet and instruction to access the V-Safe system.   Joanna [name] was instructed to call 911 with any severe reactions post vaccine: Marland Kitchen Difficulty breathing  . Swelling of face and throat  . A fast heartbeat  . A bad rash all over body  . Dizziness and weakness   Immunizations Administered    Name Date Dose VIS Date Route   Pfizer COVID-19 Vaccine 10/06/2019 11:13 AM 0.3 mL 06/06/2019 Intramuscular   Manufacturer: [employer], [employer]   Lot: [id number]   NDC: [address]-2

## 2019-11-13 ENCOUNTER — Ambulatory Visit (INDEPENDENT_AMBULATORY_CARE_PROVIDER_SITE_OTHER): Payer: BC Managed Care – PPO

## 2019-11-13 ENCOUNTER — Other Ambulatory Visit: Payer: Self-pay

## 2019-11-13 DIAGNOSIS — E790 Hyperuricemia without signs of inflammatory arthritis and tophaceous disease: Secondary | ICD-10-CM

## 2019-11-13 LAB — BASIC METABOLIC PANEL
BUN: 11 mg/dL (ref 6–23)
CO2: 28 mEq/L (ref 19–32)
Calcium: 8.7 mg/dL (ref 8.4–10.5)
Chloride: 106 mEq/L (ref 96–112)
Creatinine, Ser: 0.71 mg/dL (ref 0.40–1.20)
GFR: 96.03 mL/min (ref >60.00)
Glucose, Bld: 83 mg/dL (ref 70–99)
Potassium: 3.5 mEq/L (ref 3.5–5.1)
Sodium: 141 mEq/L (ref 135–145)

## 2019-11-13 LAB — URIC ACID: Uric Acid, Serum: 5.5 mg/dL (ref 2.4–7.0)

## 2019-11-13 NOTE — Progress Notes (Unsigned)
u

## 2019-11-16 ENCOUNTER — Other Ambulatory Visit: Payer: Self-pay | Admitting: Physician Assistant

## 2019-12-23 DIAGNOSIS — F333 Major depressive disorder, recurrent, severe with psychotic symptoms: Secondary | ICD-10-CM | POA: Diagnosis not present

## 2019-12-23 DIAGNOSIS — Z79891 Long term (current) use of opiate analgesic: Secondary | ICD-10-CM | POA: Diagnosis not present

## 2020-01-13 DIAGNOSIS — F333 Major depressive disorder, recurrent, severe with psychotic symptoms: Secondary | ICD-10-CM | POA: Diagnosis not present

## 2020-01-18 ENCOUNTER — Encounter: Payer: Self-pay | Admitting: Physician Assistant

## 2020-01-30 ENCOUNTER — Other Ambulatory Visit: Payer: Self-pay | Admitting: Physician Assistant

## 2020-01-30 ENCOUNTER — Encounter: Payer: Self-pay | Admitting: Physician Assistant

## 2020-02-02 ENCOUNTER — Other Ambulatory Visit: Payer: Self-pay | Admitting: Physician Assistant

## 2020-02-02 DIAGNOSIS — Z79899 Other long term (current) drug therapy: Secondary | ICD-10-CM

## 2020-02-03 DIAGNOSIS — F333 Major depressive disorder, recurrent, severe with psychotic symptoms: Secondary | ICD-10-CM | POA: Diagnosis not present

## 2020-02-04 ENCOUNTER — Other Ambulatory Visit: Payer: Self-pay

## 2020-02-04 ENCOUNTER — Ambulatory Visit (INDEPENDENT_AMBULATORY_CARE_PROVIDER_SITE_OTHER): Payer: BC Managed Care – PPO

## 2020-02-04 DIAGNOSIS — Z79899 Other long term (current) drug therapy: Secondary | ICD-10-CM | POA: Diagnosis not present

## 2020-02-04 LAB — LIPID PANEL
Cholesterol: 183 mg/dL (ref 0–200)
HDL: 33.9 mg/dL — ABNORMAL LOW (ref >39.00)
LDL Cholesterol: 118 mg/dL — ABNORMAL HIGH (ref 0–99)
NonHDL: 148.99
Total CHOL/HDL Ratio: 5
Triglycerides: 154 mg/dL — ABNORMAL HIGH (ref 0.0–149.0)
VLDL: 30.8 mg/dL (ref 0.0–40.0)

## 2020-02-04 LAB — VITAMIN D 25 HYDROXY (VIT D DEFICIENCY, FRACTURES): VITD: 16.95 ng/mL — ABNORMAL LOW (ref 30.00–100.00)

## 2020-02-04 LAB — HEMOGLOBIN A1C: Hgb A1c MFr Bld: 5.5 % (ref 4.6–6.5)

## 2020-02-05 LAB — THYROID PANEL WITH TSH
Free Thyroxine Index: 2.6 (ref 1.4–3.8)
T3 Uptake: 24 % (ref 22–35)
T4, Total: 10.8 ug/dL (ref 5.1–11.9)
TSH: 2.36 mIU/L

## 2020-02-06 ENCOUNTER — Other Ambulatory Visit: Payer: Self-pay | Admitting: General Practice

## 2020-02-06 MED ORDER — VITAMIN D (ERGOCALCIFEROL) 1.25 MG (50000 UNIT) PO CAPS
50000.0000 [IU] | ORAL_CAPSULE | ORAL | 0 refills | Status: DC
Start: 2020-02-06 — End: 2020-05-25

## 2020-02-17 DIAGNOSIS — F333 Major depressive disorder, recurrent, severe with psychotic symptoms: Secondary | ICD-10-CM | POA: Diagnosis not present

## 2020-02-24 DIAGNOSIS — F333 Major depressive disorder, recurrent, severe with psychotic symptoms: Secondary | ICD-10-CM | POA: Diagnosis not present

## 2020-03-02 DIAGNOSIS — F331 Major depressive disorder, recurrent, moderate: Secondary | ICD-10-CM | POA: Diagnosis not present

## 2020-03-16 ENCOUNTER — Other Ambulatory Visit: Payer: Self-pay | Admitting: Physician Assistant

## 2020-03-16 DIAGNOSIS — E790 Hyperuricemia without signs of inflammatory arthritis and tophaceous disease: Secondary | ICD-10-CM

## 2020-03-16 NOTE — Telephone Encounter (Signed)
Last office visit 09/25/19 with no upcoming appointment scheduled.

## 2020-03-23 DIAGNOSIS — F331 Major depressive disorder, recurrent, moderate: Secondary | ICD-10-CM | POA: Diagnosis not present

## 2020-03-30 DIAGNOSIS — F331 Major depressive disorder, recurrent, moderate: Secondary | ICD-10-CM | POA: Diagnosis not present

## 2020-04-19 DIAGNOSIS — F331 Major depressive disorder, recurrent, moderate: Secondary | ICD-10-CM | POA: Diagnosis not present

## 2020-04-24 ENCOUNTER — Other Ambulatory Visit: Payer: Self-pay | Admitting: Physician Assistant

## 2020-05-04 DIAGNOSIS — F331 Major depressive disorder, recurrent, moderate: Secondary | ICD-10-CM | POA: Diagnosis not present

## 2020-05-17 DIAGNOSIS — F331 Major depressive disorder, recurrent, moderate: Secondary | ICD-10-CM | POA: Diagnosis not present

## 2020-05-25 ENCOUNTER — Encounter: Payer: Self-pay | Admitting: Physician Assistant

## 2020-05-25 ENCOUNTER — Other Ambulatory Visit: Payer: Self-pay

## 2020-05-25 ENCOUNTER — Ambulatory Visit (INDEPENDENT_AMBULATORY_CARE_PROVIDER_SITE_OTHER): Payer: BC Managed Care – PPO | Admitting: Physician Assistant

## 2020-05-25 VITALS — BP 138/88 | HR 99 | Temp 98.1°F | Resp 16 | Ht 66.0 in | Wt 264.0 lb

## 2020-05-25 DIAGNOSIS — Z01419 Encounter for gynecological examination (general) (routine) without abnormal findings: Secondary | ICD-10-CM | POA: Diagnosis not present

## 2020-05-25 DIAGNOSIS — M1A0790 Idiopathic chronic gout, unspecified ankle and foot, without tophus (tophi): Secondary | ICD-10-CM | POA: Diagnosis not present

## 2020-05-25 DIAGNOSIS — L659 Nonscarring hair loss, unspecified: Secondary | ICD-10-CM | POA: Insufficient documentation

## 2020-05-25 DIAGNOSIS — Z0000 Encounter for general adult medical examination without abnormal findings: Secondary | ICD-10-CM

## 2020-05-25 DIAGNOSIS — Z1159 Encounter for screening for other viral diseases: Secondary | ICD-10-CM | POA: Diagnosis not present

## 2020-05-25 DIAGNOSIS — Z6841 Body Mass Index (BMI) 40.0 and over, adult: Secondary | ICD-10-CM | POA: Diagnosis not present

## 2020-05-25 DIAGNOSIS — R030 Elevated blood-pressure reading, without diagnosis of hypertension: Secondary | ICD-10-CM | POA: Diagnosis not present

## 2020-05-25 DIAGNOSIS — H9319 Tinnitus, unspecified ear: Secondary | ICD-10-CM | POA: Insufficient documentation

## 2020-05-25 LAB — COMPREHENSIVE METABOLIC PANEL
ALT: 37 U/L — ABNORMAL HIGH (ref 0–35)
AST: 32 U/L (ref 0–37)
Albumin: 4.3 g/dL (ref 3.5–5.2)
Alkaline Phosphatase: 72 U/L (ref 39–117)
BUN: 13 mg/dL (ref 6–23)
CO2: 33 mEq/L — ABNORMAL HIGH (ref 19–32)
Calcium: 9.6 mg/dL (ref 8.4–10.5)
Chloride: 102 mEq/L (ref 96–112)
Creatinine, Ser: 0.87 mg/dL (ref 0.40–1.20)
GFR: 88.73 mL/min (ref >60.00)
Glucose, Bld: 85 mg/dL (ref 70–99)
Potassium: 3.4 mEq/L — ABNORMAL LOW (ref 3.5–5.1)
Sodium: 143 mEq/L (ref 135–145)
Total Bilirubin: 0.7 mg/dL (ref 0.2–1.2)
Total Protein: 7.6 g/dL (ref 6.0–8.3)

## 2020-05-25 LAB — LIPID PANEL
Cholesterol: 192 mg/dL (ref 0–200)
HDL: 29.6 mg/dL — ABNORMAL LOW (ref >39.00)
NonHDL: 162.81
Total CHOL/HDL Ratio: 7
Triglycerides: 272 mg/dL — ABNORMAL HIGH (ref 0.0–149.0)
VLDL: 54.4 mg/dL — ABNORMAL HIGH (ref 0.0–40.0)

## 2020-05-25 LAB — URIC ACID: Uric Acid, Serum: 6.5 mg/dL (ref 2.4–7.0)

## 2020-05-25 LAB — CBC WITH DIFFERENTIAL/PLATELET
Basophils Absolute: 0.1 10*3/uL (ref 0.0–0.1)
Basophils Relative: 0.9 % (ref 0.0–3.0)
Eosinophils Absolute: 0.3 10*3/uL (ref 0.0–0.7)
Eosinophils Relative: 2.8 % (ref 0.0–5.0)
HCT: 42.6 % (ref 36.0–46.0)
Hemoglobin: 14.6 g/dL (ref 12.0–15.0)
Lymphocytes Relative: 29.7 % (ref 12.0–46.0)
Lymphs Abs: 3.1 10*3/uL (ref 0.7–4.0)
MCHC: 34.2 g/dL (ref 30.0–36.0)
MCV: 86.2 fl (ref 78.0–100.0)
Monocytes Absolute: 0.6 10*3/uL (ref 0.1–1.0)
Monocytes Relative: 5.5 % (ref 3.0–12.0)
Neutro Abs: 6.3 10*3/uL (ref 1.4–7.7)
Neutrophils Relative %: 61.1 % (ref 43.0–77.0)
Platelets: 256 10*3/uL (ref 150.0–400.0)
RBC: 4.95 Mil/uL (ref 3.87–5.11)
RDW: 13.5 % (ref 11.5–15.5)
WBC: 10.3 10*3/uL (ref 4.0–10.5)

## 2020-05-25 LAB — LDL CHOLESTEROL, DIRECT: Direct LDL: 126 mg/dL

## 2020-05-25 LAB — TSH: TSH: 3.49 u[IU]/mL (ref 0.35–4.50)

## 2020-05-25 LAB — HEMOGLOBIN A1C: Hgb A1c MFr Bld: 5.4 % (ref 4.6–6.5)

## 2020-05-25 NOTE — Progress Notes (Signed)
Patient presents to clinic today for annual exam.  Patient is fasting for labs.  Gout -- Currently on a regimen of allopurinol 100 mg daily. Endorses taking medication as directed and tolerating well without known side effect. Denies recurrence of gout with this regimen.   Acute Concerns: Denies acute concerns at today's visit.   Health Maintenance: Immunizations -- Flu and COVID up-to-date. PAP -- up-to-date.   Past Medical History:  Diagnosis Date   GERD (gastroesophageal reflux disease)    History of chickenpox     Past Surgical History:  Procedure Laterality Date   MANDIBLE SURGERY      Current Outpatient Medications on File Prior to Visit  Medication Sig Dispense Refill   allopurinol (ZYLOPRIM) 100 MG tablet TAKE 1 TABLET BY MOUTH EVERY DAY 90 tablet 1   azelastine (ASTELIN) 0.1 % nasal spray PLACE 1 SPRAY INTO BOTH NOSTRILS 2 TIMES DAILY. USE IN EACH NOSTRIL AS DIRECTED 30 mL 2   cetirizine (ZYRTEC) 10 MG tablet Take 10 mg by mouth daily.     erythromycin with ethanol (ERYGEL) 2 % gel Apply topically 2 (two) times daily. 30 g 0   levonorgestrel (MIRENA, 52 MG,) 20 MCG/24HR IUD Mirena 20 mcg/24 hours (5 yrs) 52 mg intrauterine device     pantoprazole (PROTONIX) 40 MG tablet TAKE 1 TABLET BY MOUTH EVERY DAY (Patient taking differently: Take 40 mg by mouth every other day. ) 90 tablet 1   Vitamin D, Ergocalciferol, (DRISDOL) 1.25 MG (50000 UNIT) CAPS capsule Take 1 capsule (50,000 Units total) by mouth every 7 (seven) days. 12 capsule 0   No current facility-administered medications on file prior to visit.    Allergies  Allergen Reactions   Latex Itching    Family History  Problem Relation Age of Onset   Fibromyalgia Mother    Arthritis Mother    Depression Mother    Anxiety disorder Mother    Hyperlipidemia Father    Mental illness Sister    Osteoporosis Maternal Grandmother    Heart disease Paternal Grandfather    Heart attack Paternal  Grandfather    Lung cancer Maternal Aunt     Social History   Socioeconomic History   Marital status: Married    Spouse name: Not on file   Number of children: Not on file   Years of education: Not on file   Highest education level: Not on file  Occupational History   Not on file  Tobacco Use   Smoking status: Never Smoker   Smokeless tobacco: Never Used  Vaping Use   Vaping Use: Never used  Substance and Sexual Activity   Alcohol use: Not Currently   Drug use: Not Currently   Sexual activity: Yes    Birth control/protection: I.U.D.  Other Topics Concern   Not on file  Social History Narrative   Not on file   Social Determinants of Health   Financial Resource Strain:    Difficulty of Paying Living Expenses: Not on file  Food Insecurity:    Worried About Running Out of Food in the Last Year: Not on file   Ran Out of Food in the Last Year: Not on file  Transportation Needs:    Lack of Transportation (Medical): Not on file   Lack of Transportation (Non-Medical): Not on file  Physical Activity:    Days of Exercise per Week: Not on file   Minutes of Exercise per Session: Not on file  Stress:    Feeling of  Stress : Not on file  Social Connections:    Frequency of Communication with Friends and Family: Not on file   Frequency of Social Gatherings with Friends and Family: Not on file   Attends Religious Services: Not on file   Active Member of Clubs or Organizations: Not on file   Attends Banker Meetings: Not on file   Marital Status: Not on file  Intimate Partner Violence:    Fear of Current or Ex-Partner: Not on file   Emotionally Abused: Not on file   Physically Abused: Not on file   Sexually Abused: Not on file   Review of Systems  Constitutional: Negative for fever and weight loss.  HENT: Negative for ear discharge, ear pain, hearing loss and tinnitus.   Eyes: Negative for blurred vision, double vision, photophobia  and pain.  Respiratory: Negative for cough and shortness of breath.   Cardiovascular: Negative for chest pain and palpitations.  Gastrointestinal: Negative for abdominal pain, blood in stool, constipation, diarrhea, heartburn, melena, nausea and vomiting.  Genitourinary: Negative for dysuria, flank pain, frequency, hematuria and urgency.  Musculoskeletal: Negative for falls.  Neurological: Negative for dizziness, loss of consciousness and headaches.  Endo/Heme/Allergies: Negative for environmental allergies.  Psychiatric/Behavioral: Negative for depression, hallucinations, substance abuse and suicidal ideas. The patient is not nervous/anxious and does not have insomnia.    Wt 264 lb (119.7 kg)    BMI 42.61 kg/m   Physical Exam Vitals reviewed.  HENT:     Head: Normocephalic and atraumatic.     Right Ear: Tympanic membrane, ear canal and external ear normal.     Left Ear: Tympanic membrane, ear canal and external ear normal.     Nose: Nose normal. No mucosal edema.     Mouth/Throat:     Pharynx: Uvula midline. No oropharyngeal exudate or posterior oropharyngeal erythema.  Eyes:     Conjunctiva/sclera: Conjunctivae normal.     Pupils: Pupils are equal, round, and reactive to light.  Neck:     Thyroid: No thyromegaly.  Cardiovascular:     Rate and Rhythm: Normal rate and regular rhythm.     Heart sounds: Normal heart sounds.  Pulmonary:     Effort: Pulmonary effort is normal. No respiratory distress.     Breath sounds: Normal breath sounds. No wheezing or rales.  Abdominal:     General: Bowel sounds are normal. There is no distension.     Palpations: Abdomen is soft. There is no mass.     Tenderness: There is no abdominal tenderness. There is no guarding or rebound.  Musculoskeletal:     Cervical back: Neck supple.  Lymphadenopathy:     Cervical: No cervical adenopathy.  Skin:    General: Skin is warm and dry.     Findings: No rash.  Neurological:     Mental Status: She is  alert and oriented to person, place, and time.     Cranial Nerves: No cranial nerve deficit.    Assessment/Plan: 1. Visit for preventive health examination Depression screen negative. Health Maintenance reviewed. Preventive schedule discussed and handout given in AVS. Will obtain fasting labs today. - CBC with Differential/Platelet - Comprehensive metabolic panel - Lipid panel - Hemoglobin A1c - TSH  2. Need for hepatitis C screening test Due per guidelines. Agrees to screening today. Order placed.  - Hepatitis C Antibody  3. Chronic gout of foot, unspecified cause, unspecified laterality No recurrence. Continue allopurinol. Repeat uric acid levels today.  - Uric acid  This visit occurred during the SARS-CoV-2 public health emergency.  Safety protocols were in place, including screening questions prior to the visit, additional usage of staff PPE, and extensive cleaning of exam room while observing appropriate contact time as indicated for disinfecting solutions.    Piedad Climes, PA-C

## 2020-05-25 NOTE — Patient Instructions (Addendum)
Please go to the lab for blood work.   Our office will call you with your results unless you have chosen to receive results via MyChart.  If your blood work is normal we will follow-up each year for physicals and as scheduled for chronic medical problems.  If anything is abnormal we will treat accordingly and get you in for a follow-up.  Avoid touching the areas of your scalp  Apply a small amount of witch hazel or sea breeze astringent to the area to help dry up the mild folliculitis. Let me know if not continuing to improve/resolve.    Preventive Care 31-18 Years Old, Female Preventive care refers to visits with your health care provider and lifestyle choices that can promote health and wellness. This includes:  A yearly physical exam. This may also be called an annual well check.  Regular dental visits and eye exams.  Immunizations.  Screening for certain conditions.  Healthy lifestyle choices, such as eating a healthy diet, getting regular exercise, not using drugs or products that contain nicotine and tobacco, and limiting alcohol use. What can I expect for my preventive care visit? Physical exam Your health care provider will check your:  Height and weight. This may be used to calculate body mass index (BMI), which tells if you are at a healthy weight.  Heart rate and blood pressure.  Skin for abnormal spots. Counseling Your health care provider may ask you questions about your:  Alcohol, tobacco, and drug use.  Emotional well-being.  Home and relationship well-being.  Sexual activity.  Eating habits.  Work and work Statistician.  Method of birth control.  Menstrual cycle.  Pregnancy history. What immunizations do I need?  Influenza (flu) vaccine  This is recommended every year. Tetanus, diphtheria, and pertussis (Tdap) vaccine  You may need a Td booster every 10 years. Varicella (chickenpox) vaccine  You may need this if you have not been  vaccinated. Human papillomavirus (HPV) vaccine  If recommended by your health care provider, you may need three doses over 6 months. Measles, mumps, and rubella (MMR) vaccine  You may need at least one dose of MMR. You may also need a second dose. Meningococcal conjugate (MenACWY) vaccine  One dose is recommended if you are age 50-21 years and a first-year college student living in a residence hall, or if you have one of several medical conditions. You may also need additional booster doses. Pneumococcal conjugate (PCV13) vaccine  You may need this if you have certain conditions and were not previously vaccinated. Pneumococcal polysaccharide (PPSV23) vaccine  You may need one or two doses if you smoke cigarettes or if you have certain conditions. Hepatitis A vaccine  You may need this if you have certain conditions or if you travel or work in places where you may be exposed to hepatitis A. Hepatitis B vaccine  You may need this if you have certain conditions or if you travel or work in places where you may be exposed to hepatitis B. Haemophilus influenzae type b (Hib) vaccine  You may need this if you have certain conditions. You may receive vaccines as individual doses or as more than one vaccine together in one shot (combination vaccines). Talk with your health care provider about the risks and benefits of combination vaccines. What tests do I need?  Blood tests  Lipid and cholesterol levels. These may be checked every 5 years starting at age 43.  Hepatitis C test.  Hepatitis B test. Screening  Diabetes screening.  This is done by checking your blood sugar (glucose) after you have not eaten for a while (fasting).  Sexually transmitted disease (STD) testing.  BRCA-related cancer screening. This may be done if you have a family history of breast, ovarian, tubal, or peritoneal cancers.  Pelvic exam and Pap test. This may be done every 3 years starting at age 13. Starting at  age 23, this may be done every 5 years if you have a Pap test in combination with an HPV test. Talk with your health care provider about your test results, treatment options, and if necessary, the need for more tests. Follow these instructions at home: Eating and drinking   Eat a diet that includes fresh fruits and vegetables, whole grains, lean protein, and low-fat dairy.  Take vitamin and mineral supplements as recommended by your health care provider.  Do not drink alcohol if: ? Your health care provider tells you not to drink. ? You are pregnant, may be pregnant, or are planning to become pregnant.  If you drink alcohol: ? Limit how much you have to 0-1 drink a day. ? Be aware of how much alcohol is in your drink. In the [place] one drink equals one 12 oz bottle of beer (355 mL), one 5 oz glass of wine (148 mL), or one 1 oz glass of hard liquor (44 mL). Lifestyle  Take daily care of your teeth and gums.  Stay active. Exercise for at least 30 minutes on 5 or more days each week.  Do not use any products that contain nicotine or tobacco, such as cigarettes, e-cigarettes, and chewing tobacco. If you need help quitting, ask your health care provider.  If you are sexually active, practice safe sex. Use a condom or other form of birth control (contraception) in order to prevent pregnancy and STIs (sexually transmitted infections). If you plan to become pregnant, see your health care provider for a preconception visit. What's next?  Visit your health care provider once a year for a well check visit.  Ask your health care provider how often you should have your eyes and teeth checked.  Stay up to date on all vaccines. This information is not intended to replace advice given to you by your health care provider. Make sure you discuss any questions you have with your health care provider. Document Revised: 02/21/2018 Document Reviewed: 02/21/2018 Elsevier Patient Education  2020 [employer].

## 2020-05-26 LAB — HEPATITIS C ANTIBODY
Hepatitis C Ab: NONREACTIVE
SIGNAL TO CUT-OFF: 0.02 (ref <1.00)

## 2020-06-01 DIAGNOSIS — F330 Major depressive disorder, recurrent, mild: Secondary | ICD-10-CM | POA: Diagnosis not present

## 2020-06-08 DIAGNOSIS — F330 Major depressive disorder, recurrent, mild: Secondary | ICD-10-CM | POA: Diagnosis not present

## 2020-06-22 DIAGNOSIS — F330 Major depressive disorder, recurrent, mild: Secondary | ICD-10-CM | POA: Diagnosis not present

## 2020-07-06 DIAGNOSIS — F330 Major depressive disorder, recurrent, mild: Secondary | ICD-10-CM | POA: Diagnosis not present

## 2020-07-20 DIAGNOSIS — F330 Major depressive disorder, recurrent, mild: Secondary | ICD-10-CM | POA: Diagnosis not present

## 2020-08-05 DIAGNOSIS — F330 Major depressive disorder, recurrent, mild: Secondary | ICD-10-CM | POA: Diagnosis not present

## 2020-08-18 DIAGNOSIS — F330 Major depressive disorder, recurrent, mild: Secondary | ICD-10-CM | POA: Diagnosis not present

## 2020-09-01 DIAGNOSIS — F330 Major depressive disorder, recurrent, mild: Secondary | ICD-10-CM | POA: Diagnosis not present

## 2020-09-15 DIAGNOSIS — F330 Major depressive disorder, recurrent, mild: Secondary | ICD-10-CM | POA: Diagnosis not present

## 2020-09-16 ENCOUNTER — Other Ambulatory Visit: Payer: Self-pay | Admitting: Family Medicine

## 2020-09-16 DIAGNOSIS — E790 Hyperuricemia without signs of inflammatory arthritis and tophaceous disease: Secondary | ICD-10-CM

## 2020-09-29 DIAGNOSIS — F330 Major depressive disorder, recurrent, mild: Secondary | ICD-10-CM | POA: Diagnosis not present

## 2020-10-13 DIAGNOSIS — F330 Major depressive disorder, recurrent, mild: Secondary | ICD-10-CM | POA: Diagnosis not present

## 2020-12-17 ENCOUNTER — Other Ambulatory Visit: Payer: Self-pay | Admitting: Family Medicine

## 2020-12-17 ENCOUNTER — Encounter: Payer: Self-pay | Admitting: Registered Nurse

## 2020-12-17 ENCOUNTER — Telehealth (INDEPENDENT_AMBULATORY_CARE_PROVIDER_SITE_OTHER): Payer: BC Managed Care – PPO | Admitting: Registered Nurse

## 2020-12-17 ENCOUNTER — Other Ambulatory Visit: Payer: Self-pay

## 2020-12-17 VITALS — Temp 100.1°F

## 2020-12-17 DIAGNOSIS — U071 COVID-19: Secondary | ICD-10-CM

## 2020-12-17 DIAGNOSIS — E790 Hyperuricemia without signs of inflammatory arthritis and tophaceous disease: Secondary | ICD-10-CM

## 2020-12-17 MED ORDER — DM-GUAIFENESIN ER 30-600 MG PO TB12: 1.0000 | ORAL_TABLET | Freq: Two times a day (BID) | ORAL | 0 refills | Status: AC

## 2020-12-17 MED ORDER — AZELASTINE HCL 0.1 % NA SOLN: NASAL | 2 refills | Status: AC

## 2020-12-17 MED ORDER — PREDNISONE 10 MG (21) PO TBPK: ORAL_TABLET | ORAL | 0 refills | Status: AC

## 2020-12-17 NOTE — Progress Notes (Signed)
Telemedicine Encounter- SOAP NOTE Established Patient  This telephone encounter was conducted with the patient's (or proxy's) verbal consent via audio telecommunications: yes/no: Yes Patient was instructed to have this encounter in a suitably private space; and to only have persons present to whom they give permission to participate. In addition, patient identity was confirmed by use of name plus two identifiers (DOB and address).  I discussed the limitations, risks, security and privacy concerns of performing an evaluation and management service by telephone and the availability of in person appointments. I also discussed with the patient that there may be a patient responsible charge related to this service. The patient expressed understanding and agreed to proceed.  I spent a total of TIME; 0 MIN TO 60 MIN: 15 minutes talking with the patient or their proxy.  Patient at home Provider in office  Participants: Joanna Sportsman, NP and Joanna [name]  Chief Complaint  Patient presents with   Covid Positive    Patient states she tested positive for covid on yesterday. She is having body aches, had a fever of 100.1, night sweats, and nasal congestion. She has been taking Ibuprofen with some relief.    Subjective   Joanna [name] is a 30 y.o. established patient. Telephone visit today for COVID  HPI Positive test yesterday Known exposure - husband tested positive Body aches, fever, night sweats, nasal congestion Some sinus headache, mild Denies nvd, lower respiratory symptoms, sensory changes. Has been taking ibuprofen for aches - helps somewhat Interested in antiviral therapy  No shob, doe, chest pain, headache,   Patient Active Problem List   Diagnosis Date Noted   IUD contraception 01/07/2019   Irregular periods 01/07/2019   Gastroesophageal reflux disease without esophagitis 01/07/2019   Polycystic ovary syndrome 04/29/2018    Past Medical History:  Diagnosis Date    GERD (gastroesophageal reflux disease)    History of chickenpox     Current Outpatient Medications  Medication Sig Dispense Refill   allopurinol (ZYLOPRIM) 100 MG tablet TAKE 1 TABLET BY MOUTH EVERY DAY 90 tablet 0   azelastine (ASTELIN) 0.1 % nasal spray PLACE 1 SPRAY INTO BOTH NOSTRILS 2 TIMES DAILY. USE IN EACH NOSTRIL AS DIRECTED 30 mL 2   cetirizine (ZYRTEC) 10 MG tablet Take 10 mg by mouth daily.     DULoxetine (CYMBALTA) 60 MG capsule Take 60 mg by mouth daily.     levonorgestrel (MIRENA, 52 MG,) 20 MCG/24HR IUD Mirena 20 mcg/24 hours (5 yrs) 52 mg intrauterine device     Vitamin D, Cholecalciferol, 25 MCG (1000 UT) TABS Take 1 tablet by mouth daily.     No current facility-administered medications for this visit.    Allergies  Allergen Reactions   Latex Itching    Social History   Socioeconomic History   Marital status: Married    Spouse name: Not on file   Number of children: Not on file   Years of education: Not on file   Highest education level: Not on file  Occupational History   Not on file  Tobacco Use   Smoking status: Never   Smokeless tobacco: Never  Vaping Use   Vaping Use: Never used  Substance and Sexual Activity   Alcohol use: Not Currently   Drug use: Not Currently   Sexual activity: Yes    Birth control/protection: I.U.D.  Other Topics Concern   Not on file  Social History Narrative   Not on file   Social Determinants of Health  Financial Resource Strain: Not on file  Food Insecurity: Not on file  Transportation Needs: Not on file  Physical Activity: Not on file  Stress: Not on file  Social Connections: Not on file  Intimate Partner Violence: Not on file    Review of Systems  Constitutional: Negative.   HENT:  Positive for congestion and sinus pain. Negative for ear discharge, ear pain, hearing loss, nosebleeds, sore throat and tinnitus.   Eyes: Negative.   Respiratory: Negative.  Negative for cough and stridor.   Cardiovascular:  Negative.  Negative for chest pain.  Gastrointestinal: Negative.   Genitourinary: Negative.   Musculoskeletal:  Positive for myalgias. Negative for back pain, falls, joint pain and neck pain.  Skin: Negative.   Neurological: Negative.   Endo/Heme/Allergies: Negative.   Psychiatric/Behavioral: Negative.    All other systems reviewed and are negative.  Objective   Vitals as reported by the patient: Today's Vitals   12/17/20 0953  Temp: 100.1 F (37.8 C)  TempSrc: Temporal    [name] was seen today for covid positive.  Diagnoses and all orders for this visit:  COVID-19 -     predniSONE (STERAPRED UNI-PAK 21 TAB) 10 MG (21) TBPK tablet; Take per package instructions. Do not skip doses. Finish entire supply. -     dextromethorphan-guaiFENesin (MUCINEX DM) 30-600 MG 12hr tablet; Take 1 tablet by mouth 2 (two) times daily. -     azelastine (ASTELIN) 0.1 % nasal spray; PLACE 1 SPRAY INTO BOTH NOSTRILS 2 TIMES DAILY. USE IN EACH NOSTRIL AS DIRECTED  PLAN Discussed r/b/se of antiviral therapy with pt, who declines Will pursue supportive care with prednisone taper, mucinex dm, and azelastine nasal spray Discussed return and ER precautions Discussed isolation guidelines Pt voices understanding of plan Patient encouraged to call clinic with any questions, comments, or concerns.  I discussed the assessment and treatment plan with the patient. The patient was provided an opportunity to ask questions and all were answered. The patient agreed with the plan and demonstrated an understanding of the instructions.   The patient was advised to call back or seek an in-person evaluation if the symptoms worsen or if the condition fails to improve as anticipated.  I provided 15 minutes of non-face-to-face time during this encounter.  Joanna Agee, NP  [facility] at [facility]

## 2020-12-17 NOTE — Patient Instructions (Signed)
° ° ° °  If you have lab work done today you will be contacted with your lab results within the next 2 weeks.  If you have not heard from us then please contact us. The fastest way to get your results is to register for My Chart. ° ° °IF you received an x-ray today, you will receive an invoice from [facility]. Please contact  Radiology at [phone] with questions or concerns regarding your invoice.  ° °IF you received labwork today, you will receive an invoice from [facility]. Please contact [facility] at [phone] with questions or concerns regarding your invoice.  ° °Our billing staff will not be able to assist you with questions regarding bills from these companies. ° °You will be contacted with the lab results as soon as they are available. The fastest way to get your results is to activate your My Chart account. Instructions are located on the last page of this paperwork. If you have not heard from us regarding the results in 2 weeks, please contact this office. °  ° ° ° °

## 2020-12-17 NOTE — Telephone Encounter (Signed)
Please advise if appropriate. Patient has appt today

## 2021-02-16 DIAGNOSIS — F330 Major depressive disorder, recurrent, mild: Secondary | ICD-10-CM | POA: Diagnosis not present

## 2021-03-03 ENCOUNTER — Telehealth: Payer: Self-pay

## 2021-03-03 ENCOUNTER — Ambulatory Visit (INDEPENDENT_AMBULATORY_CARE_PROVIDER_SITE_OTHER): Payer: BC Managed Care – PPO | Admitting: Registered Nurse

## 2021-03-03 ENCOUNTER — Other Ambulatory Visit: Payer: Self-pay

## 2021-03-03 ENCOUNTER — Encounter: Payer: Self-pay | Admitting: Registered Nurse

## 2021-03-03 VITALS — BP 118/80 | HR 98 | Temp 97.8°F | Resp 18 | Ht 66.0 in | Wt 259.4 lb

## 2021-03-03 DIAGNOSIS — Z13220 Encounter for screening for lipoid disorders: Secondary | ICD-10-CM | POA: Diagnosis not present

## 2021-03-03 DIAGNOSIS — L299 Pruritus, unspecified: Secondary | ICD-10-CM

## 2021-03-03 DIAGNOSIS — Z1329 Encounter for screening for other suspected endocrine disorder: Secondary | ICD-10-CM | POA: Diagnosis not present

## 2021-03-03 DIAGNOSIS — Z0000 Encounter for general adult medical examination without abnormal findings: Secondary | ICD-10-CM | POA: Diagnosis not present

## 2021-03-03 DIAGNOSIS — R21 Rash and other nonspecific skin eruption: Secondary | ICD-10-CM | POA: Diagnosis not present

## 2021-03-03 DIAGNOSIS — Z130 Encounter for screening for diseases of the blood and blood-forming organs and certain disorders involving the immune mechanism: Secondary | ICD-10-CM

## 2021-03-03 DIAGNOSIS — Z13228 Encounter for screening for other metabolic disorders: Secondary | ICD-10-CM

## 2021-03-03 LAB — CBC WITH DIFFERENTIAL/PLATELET
Basophils Absolute: 0.2 10*3/uL — ABNORMAL HIGH (ref 0.0–0.1)
Basophils Relative: 2.7 % (ref 0.0–3.0)
Eosinophils Absolute: 0.3 10*3/uL (ref 0.0–0.7)
Eosinophils Relative: 3.3 % (ref 0.0–5.0)
HCT: 44.4 % (ref 36.0–46.0)
Hemoglobin: 15.1 g/dL — ABNORMAL HIGH (ref 12.0–15.0)
Lymphocytes Relative: 28.3 % (ref 12.0–46.0)
Lymphs Abs: 2.6 10*3/uL (ref 0.7–4.0)
MCHC: 33.9 g/dL (ref 30.0–36.0)
MCV: 88.4 fl (ref 78.0–100.0)
Monocytes Absolute: 0.5 10*3/uL (ref 0.1–1.0)
Monocytes Relative: 5.1 % (ref 3.0–12.0)
Neutro Abs: 5.5 10*3/uL (ref 1.4–7.7)
Neutrophils Relative %: 60.6 % (ref 43.0–77.0)
Platelets: 271 10*3/uL (ref 150.0–400.0)
RBC: 5.02 Mil/uL (ref 3.87–5.11)
RDW: 13.6 % (ref 11.5–15.5)
WBC: 9 10*3/uL (ref 4.0–10.5)

## 2021-03-03 LAB — COMPREHENSIVE METABOLIC PANEL
ALT: 38 U/L — ABNORMAL HIGH (ref 0–35)
AST: 30 U/L (ref 0–37)
Albumin: 4.2 g/dL (ref 3.5–5.2)
Alkaline Phosphatase: 67 U/L (ref 39–117)
BUN: 11 mg/dL (ref 6–23)
CO2: 32 mEq/L (ref 19–32)
Calcium: 9.2 mg/dL (ref 8.4–10.5)
Chloride: 101 mEq/L (ref 96–112)
Creatinine, Ser: 0.89 mg/dL (ref 0.40–1.20)
GFR: 85.87 mL/min (ref >60.00)
Glucose, Bld: 71 mg/dL (ref 70–99)
Potassium: 3.3 mEq/L — ABNORMAL LOW (ref 3.5–5.1)
Sodium: 142 mEq/L (ref 135–145)
Total Bilirubin: 1.3 mg/dL — ABNORMAL HIGH (ref 0.2–1.2)
Total Protein: 7.5 g/dL (ref 6.0–8.3)

## 2021-03-03 LAB — LIPID PANEL
Cholesterol: 180 mg/dL (ref 0–200)
HDL: 35.3 mg/dL — ABNORMAL LOW (ref >39.00)
LDL Cholesterol: 117 mg/dL — ABNORMAL HIGH (ref 0–99)
NonHDL: 144.24
Total CHOL/HDL Ratio: 5
Triglycerides: 134 mg/dL (ref 0.0–149.0)
VLDL: 26.8 mg/dL (ref 0.0–40.0)

## 2021-03-03 LAB — HEMOGLOBIN A1C: Hgb A1c MFr Bld: 5.4 % (ref 4.6–6.5)

## 2021-03-03 LAB — TSH: TSH: 3.62 u[IU]/mL (ref 0.35–5.50)

## 2021-03-03 MED ORDER — TERBINAFINE HCL 1 % EX CREA: 1.0000 "application " | TOPICAL_CREAM | Freq: Two times a day (BID) | CUTANEOUS | 0 refills | Status: AC

## 2021-03-03 MED ORDER — TRIAMCINOLONE ACETONIDE 0.1 % EX CREA: 1.0000 "application " | TOPICAL_CREAM | Freq: Two times a day (BID) | CUTANEOUS | 0 refills | Status: AC

## 2021-03-03 NOTE — Telephone Encounter (Signed)
Pt called her insurance and you can file this under preventative care or Phy and the insurance will cover

## 2021-03-03 NOTE — Progress Notes (Signed)
Established Patient Office Visit  Subjective:  Patient ID: Joanna [name], female    DOB: 1991/01/15  Age: 30 y.o. MRN: [id number]  CC:  Chief Complaint  Patient presents with   Transitions Of Care    HPI Joanna [name] presents for Visit to est care.  Formerly pt of [place], [place].   Today has concerns for rash on foot and itching by ears.  Rash Ongoing 1-2 mo Small red bumps on top of foot Starting to spread towards ankle in the past week Itching infrequently, maybe every other day Has tried benadryl, no relief No new hygiene products, pets, living situations, hobbies  Itching Behind both ears On and off for some time Hyperpigmented but no drainage or distinct lesions Worse in summers, worse with sweating    Of note, appears as Malva Coganody Martin had wanted to repeat a lipid panel for Ms. Plunk Last lipids Lab Results  Component Value Date   CHOL 192 05/25/2020   HDL 29.60 (L) 05/25/2020   LDLCALC 118 (H) 02/04/2020   LDLDIRECT 126.0 05/25/2020   TRIG 272.0 (H) 05/25/2020   CHOLHDL 7 05/25/2020   She is not currently on medication.  Family history significant for hyperlipidemia in father heart disease in grandparents.    Past Medical History:  Diagnosis Date   GERD (gastroesophageal reflux disease)    History of chickenpox     Past Surgical History:  Procedure Laterality Date   MANDIBLE SURGERY      Family History  Problem Relation Age of Onset   Fibromyalgia Mother    Arthritis Mother    Depression Mother    Anxiety disorder Mother    Cataracts Mother    Hyperlipidemia Father    Mental illness Sister    Osteoporosis Maternal Grandmother    Heart disease Paternal Grandfather    Heart attack Paternal Grandfather    Lung cancer Maternal Aunt     Social History   Socioeconomic History   Marital status: Married    Spouse name: Not on file   Number of children: 0   Years of education: Not on file   Highest education level: Not on  file  Occupational History   Occupation: Homemaker  Tobacco Use   Smoking status: Never   Smokeless tobacco: Never  Vaping Use   Vaping Use: Never used  Substance and Sexual Activity   Alcohol use: Not Currently   Drug use: Not Currently   Sexual activity: Yes    Birth control/protection: I.U.D.  Other Topics Concern   Not on file  Social History Narrative   Not on file   Social Determinants of Health   Financial Resource Strain: Not on file  Food Insecurity: Not on file  Transportation Needs: Not on file  Physical Activity: Not on file  Stress: Not on file  Social Connections: Not on file  Intimate Partner Violence: Not on file    Outpatient Medications Prior to Visit  Medication Sig Dispense Refill   allopurinol (ZYLOPRIM) 100 MG tablet TAKE 1 TABLET BY MOUTH EVERY DAY 90 tablet 0   azelastine (ASTELIN) 0.1 % nasal spray PLACE 1 SPRAY INTO BOTH NOSTRILS 2 TIMES DAILY. USE IN EACH NOSTRIL AS DIRECTED 30 mL 2   buPROPion (WELLBUTRIN XL) 150 MG 24 hr tablet Take by mouth.     cetirizine (ZYRTEC) 10 MG tablet Take 10 mg by mouth daily.     DULoxetine (CYMBALTA) 60 MG capsule Take 60 mg by mouth daily.  levonorgestrel (MIRENA, 52 MG,) 20 MCG/24HR IUD Mirena 20 mcg/24 hours (5 yrs) 52 mg intrauterine device     Vitamin D, Cholecalciferol, 25 MCG (1000 UT) TABS Take 1 tablet by mouth daily.     dextromethorphan-guaiFENesin (MUCINEX DM) 30-600 MG 12hr tablet Take 1 tablet by mouth 2 (two) times daily. 20 tablet 0   predniSONE (STERAPRED UNI-PAK 21 TAB) 10 MG (21) TBPK tablet Take per package instructions. Do not skip doses. Finish entire supply. 1 each 0   No facility-administered medications prior to visit.    Allergies  Allergen Reactions   Latex Itching    ROS Review of Systems  All other systems reviewed and are negative.    Objective:    Physical Exam Vitals and nursing note reviewed.  Constitutional:      General: She is not in acute distress.     Appearance: Normal appearance. She is normal weight. She is not ill-appearing, toxic-appearing or diaphoretic.  HENT:     Head: Normocephalic and atraumatic.     Right Ear: Tympanic membrane, ear canal and external ear normal. There is no impacted cerumen.     Left Ear: Tympanic membrane, ear canal and external ear normal. There is no impacted cerumen.     Nose: Nose normal. No congestion or rhinorrhea.     Mouth/Throat:     Mouth: Mucous membranes are moist.     Pharynx: Oropharynx is clear. No oropharyngeal exudate or posterior oropharyngeal erythema.  Eyes:     General: No scleral icterus.       Right eye: No discharge.        Left eye: No discharge.     Extraocular Movements: Extraocular movements intact.     Conjunctiva/sclera: Conjunctivae normal.     Pupils: Pupils are equal, round, and reactive to light.  Cardiovascular:     Rate and Rhythm: Normal rate and regular rhythm.     Pulses: Normal pulses.     Heart sounds: Normal heart sounds. No murmur heard.   No friction rub. No gallop.  Pulmonary:     Effort: Pulmonary effort is normal. No respiratory distress.     Breath sounds: Normal breath sounds. No stridor. No wheezing, rhonchi or rales.  Chest:     Chest wall: No tenderness.  Abdominal:     General: Abdomen is flat. Bowel sounds are normal. There is no distension.     Palpations: Abdomen is soft. There is no mass.     Tenderness: There is no abdominal tenderness. There is no right CVA tenderness, left CVA tenderness, guarding or rebound.     Hernia: No hernia is present.  Musculoskeletal:        General: No swelling, tenderness, deformity or signs of injury. Normal range of motion.     Right lower leg: No edema.     Left lower leg: No edema.  Skin:    General: Skin is warm and dry.     Capillary Refill: Capillary refill takes less than 2 seconds.     Coloration: Skin is not jaundiced or pale.     Findings: Rash (blanchable red bumps on superior r foot and towards  medial ankle. posterior of both ears showing hyperpigmented areas of poorly defined rash without distinct lesions or drainage) present. No bruising, erythema or lesion.  Neurological:     General: No focal deficit present.     Mental Status: She is alert and oriented to person, place, and time. Mental status is at baseline.  Cranial Nerves: No cranial nerve deficit.     Sensory: No sensory deficit.     Motor: No weakness.     Coordination: Coordination normal.     Gait: Gait normal.     Deep Tendon Reflexes: Reflexes normal.  Psychiatric:        Mood and Affect: Mood normal.        Behavior: Behavior normal.        Thought Content: Thought content normal.        Judgment: Judgment normal.    BP 118/80   Pulse 98   Temp 97.8 F (36.6 C) (Temporal)   Resp 18   Ht 5\' 6"  (1.676 m)   Wt 259 lb 6.4 oz (117.7 kg)   SpO2 98%   BMI 41.87 kg/m  Wt Readings from Last 3 Encounters:  03/03/21 259 lb 6.4 oz (117.7 kg)  05/25/20 264 lb (119.7 kg)  03/17/19 265 lb (120.2 kg)     Health Maintenance Due  Topic Date Due   COVID-19 Vaccine (5 - Booster for Pfizer series) 08/20/2020   INFLUENZA VACCINE  01/24/2021   PAP SMEAR-Modifier  05/02/2021    There are no preventive care reminders to display for this patient.  Lab Results  Component Value Date   TSH 3.49 05/25/2020   Lab Results  Component Value Date   WBC 10.3 05/25/2020   HGB 14.6 05/25/2020   HCT 42.6 05/25/2020   MCV 86.2 05/25/2020   PLT 256.0 05/25/2020   Lab Results  Component Value Date   NA 143 05/25/2020   K 3.4 (L) 05/25/2020   CO2 33 (H) 05/25/2020   GLUCOSE 85 05/25/2020   BUN 13 05/25/2020   CREATININE 0.87 05/25/2020   BILITOT 0.7 05/25/2020   ALKPHOS 72 05/25/2020   AST 32 05/25/2020   ALT 37 (H) 05/25/2020   PROT 7.6 05/25/2020   ALBUMIN 4.3 05/25/2020   CALCIUM 9.6 05/25/2020   GFR 88.73 05/25/2020   Lab Results  Component Value Date   CHOL 192 05/25/2020   Lab Results  Component  Value Date   HDL 29.60 (L) 05/25/2020   Lab Results  Component Value Date   LDLCALC 118 (H) 02/04/2020   Lab Results  Component Value Date   TRIG 272.0 (H) 05/25/2020   Lab Results  Component Value Date   CHOLHDL 7 05/25/2020   Lab Results  Component Value Date   HGBA1C 5.4 05/25/2020      Assessment & Plan:   Problem List Items Addressed This Visit   None Visit Diagnoses     Rash of foot    -  Primary   Relevant Medications   triamcinolone cream (KENALOG) 0.1 %   Itching of ear       Relevant Medications   terbinafine (LAMISIL) 1 % cream   Annual physical exam       Screening for endocrine, metabolic and immunity disorder       Relevant Orders   CBC with Differential/Platelet   Comprehensive metabolic panel   Hemoglobin A1c   TSH   Lipid screening       Relevant Orders   Lipid panel       Meds ordered this encounter  Medications   terbinafine (LAMISIL) 1 % cream    Sig: Apply 1 application topically 2 (two) times daily.    Dispense:  30 g    Refill:  0    Order Specific Question:   Supervising Provider  Answer:   Neva Seat, JEFFREY R [[id number]]   triamcinolone cream (KENALOG) 0.1 %    Sig: Apply 1 application topically 2 (two) times daily.    Dispense:  30 g    Refill:  0    Order Specific Question:   Supervising Provider    Answer:   Neva Seat, JEFFREY R [[id number]]    Follow-up: Return in about 1 year (around 03/03/2022) for CPE and labs.   PLAN Other than foot and ear, exam unremarkable Foot appears as inflmamatory rash - uncertain of etiology - will give steroids and follow up if worsening or failing to improve Ears appear to be fungal - treat with terbinafine as above. Labs collected. Will follow up with the patient as warranted. Patient encouraged to call clinic with any questions, comments, or concerns.  Janeece Agee, NP

## 2021-03-03 NOTE — Patient Instructions (Signed)
Mrs. Deblasi -  Randie Heinz to meet you (in person)!  Let's recheck labs. Results expected this afternoon  Foot: use the steroid triamcinolone to help with this. Can use twice daily until rash resolved.   Ears: use terbinafine (lamisil) antifungal twice daily until resolved.  If either worsens or fails to improve, let me know  If you are able - give your insurance a call and see if they cover a physical every calendar year or every 366th day - then we can properly bill today's visit and get this checked off for the year.  Thank you  Rich

## 2021-03-08 NOTE — Telephone Encounter (Signed)
Done!  Thanks,  Rich

## 2021-03-09 DIAGNOSIS — F330 Major depressive disorder, recurrent, mild: Secondary | ICD-10-CM | POA: Diagnosis not present

## 2021-03-13 ENCOUNTER — Other Ambulatory Visit: Payer: Self-pay | Admitting: Registered Nurse

## 2021-03-13 DIAGNOSIS — E790 Hyperuricemia without signs of inflammatory arthritis and tophaceous disease: Secondary | ICD-10-CM

## 2021-04-05 IMAGING — DX DG LUMBAR SPINE COMPLETE 4+V
4 series · 4 of 4 positions shown · non-contrast
Comparison: None.

CLINICAL DATA: Chronic bilateral low back pain

EXAM:
LUMBAR SPINE - COMPLETE 4+ VIEW

[lumbar spine ap]
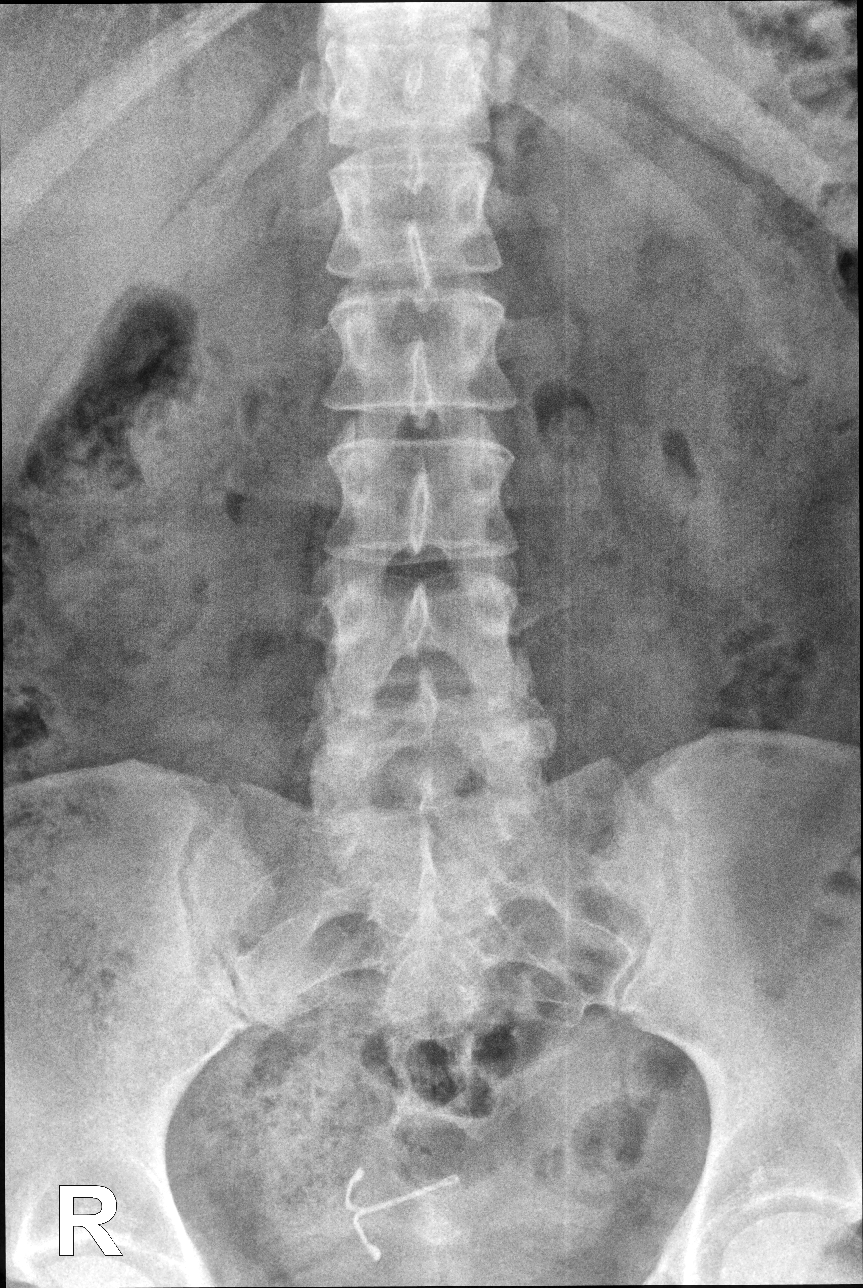

[lumbar spine oblique (1 of 2)]
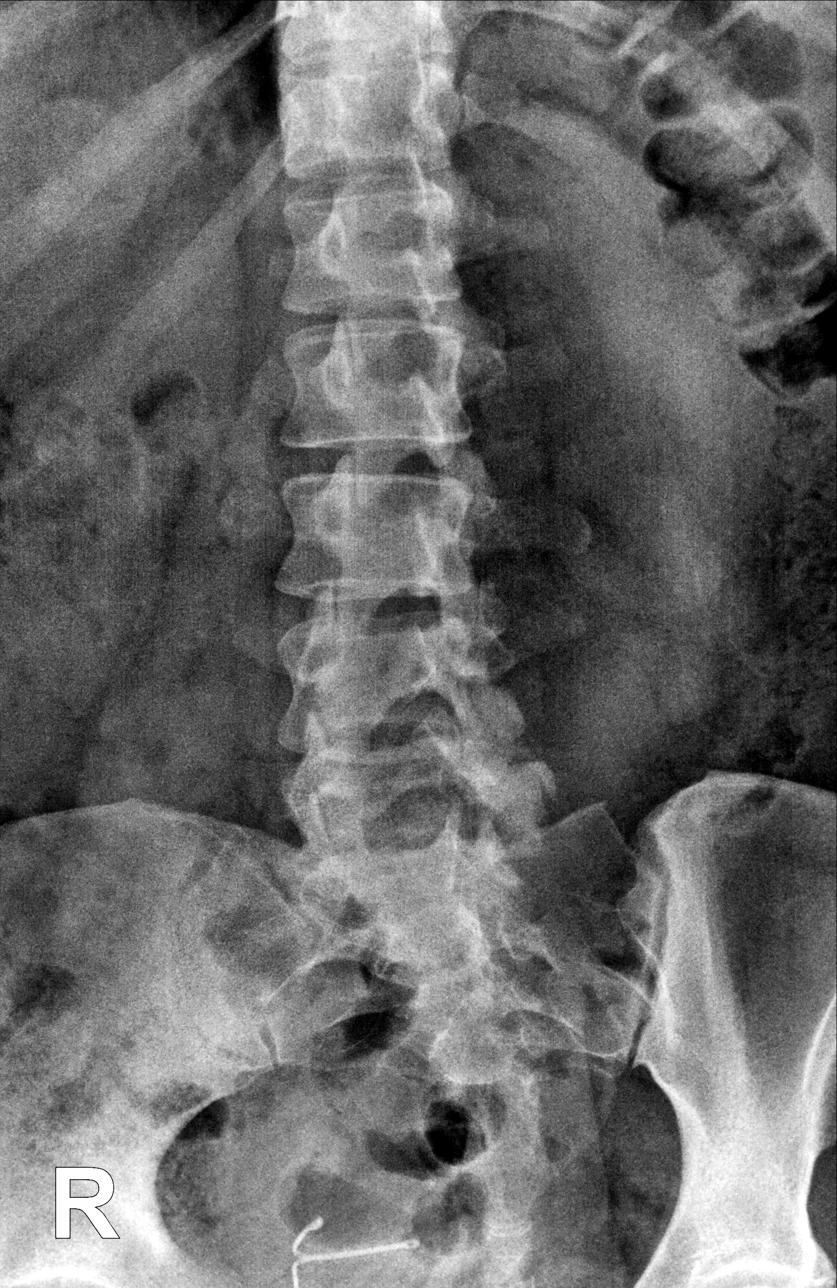

[lumbar spine oblique (2 of 2)]
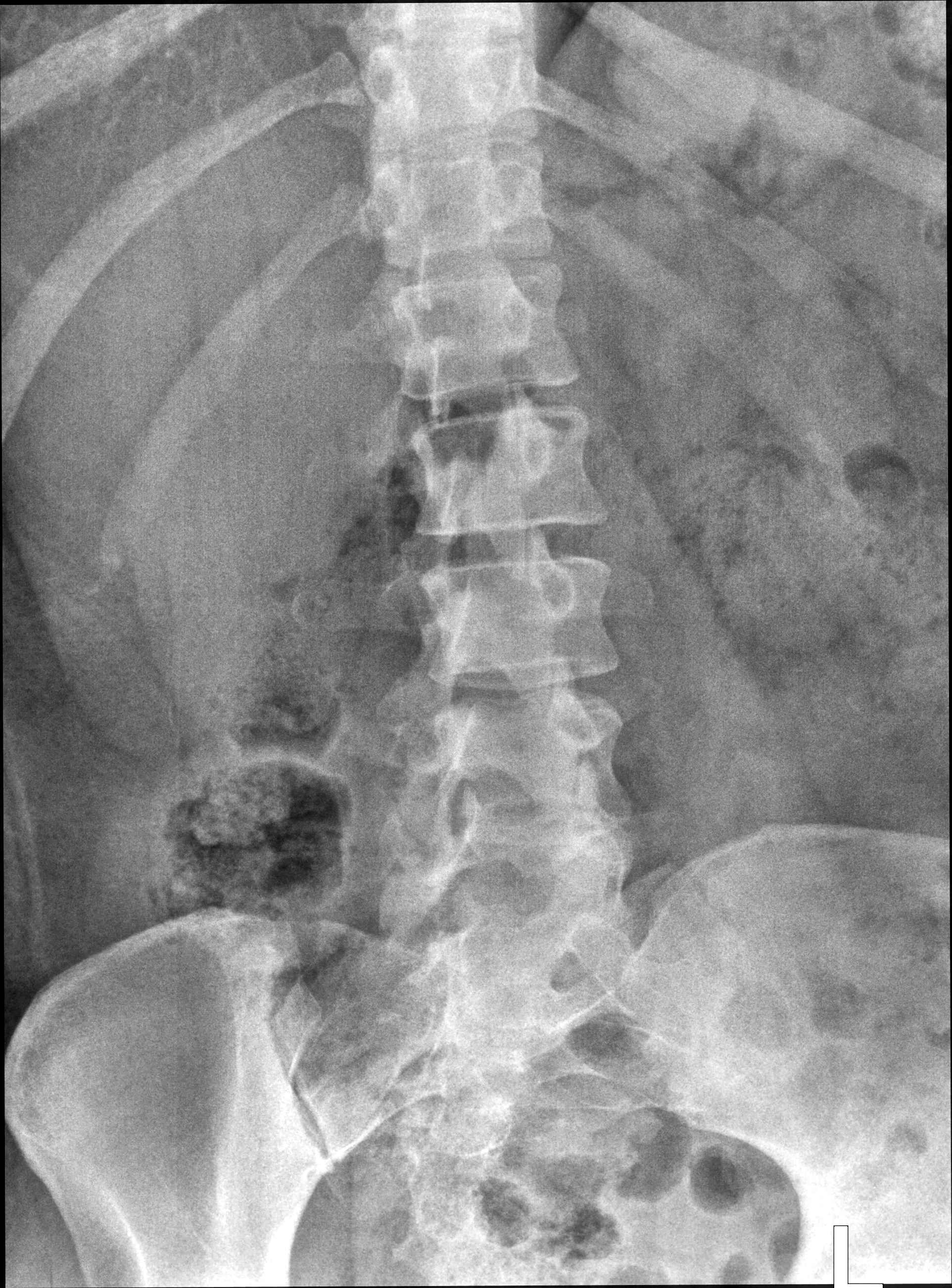

[lumbar spine lat]
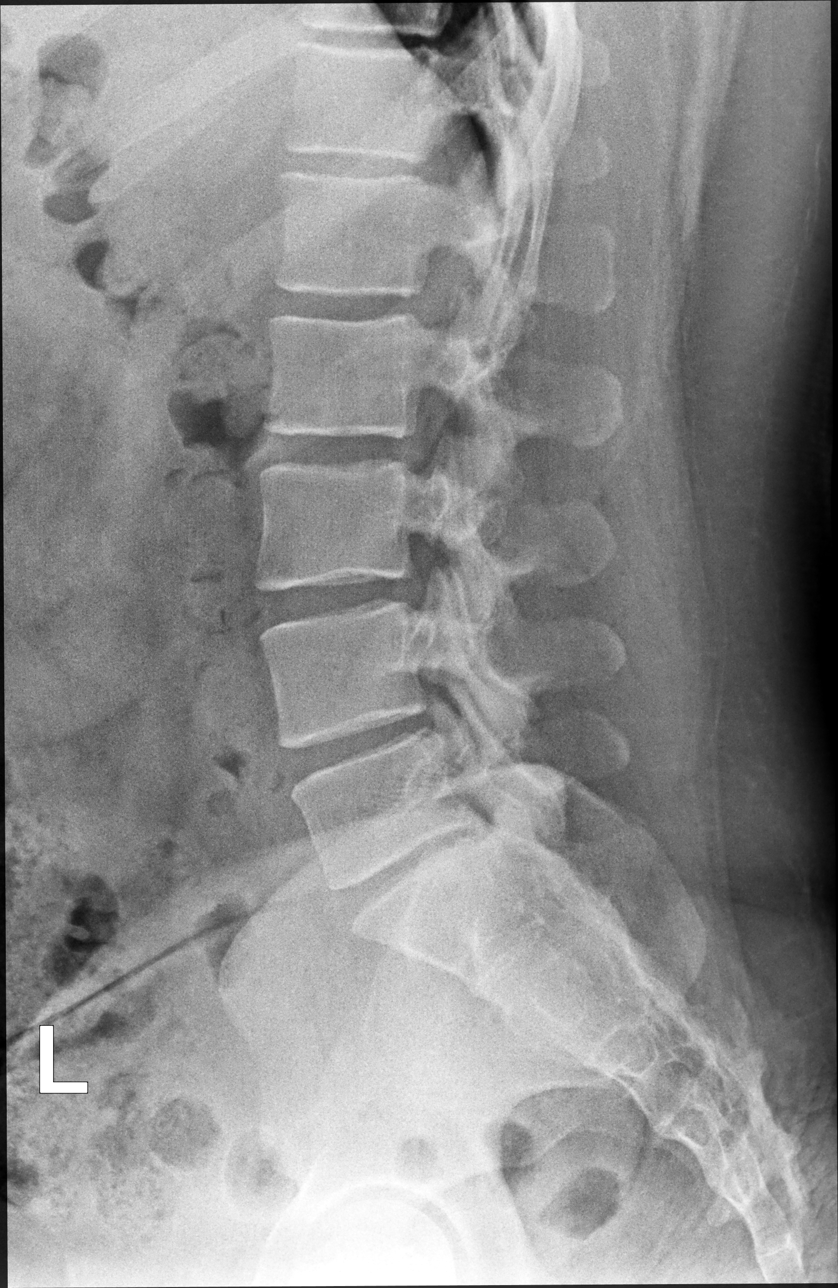

[4 of 4 positions shown; findings below may reference images not displayed]

FINDINGS: Slight disc space narrowing at L5-S1. Normal alignment. No fracture.
SI joints symmetric and unremarkable. IUD oriented transversely in
the pelvis.
IMPRESSION: Early degenerative disc disease at L5-S1. No acute bony abnormality.

## 2021-04-13 DIAGNOSIS — F330 Major depressive disorder, recurrent, mild: Secondary | ICD-10-CM | POA: Diagnosis not present

## 2021-05-12 DIAGNOSIS — F330 Major depressive disorder, recurrent, mild: Secondary | ICD-10-CM | POA: Diagnosis not present

## 2021-05-25 DIAGNOSIS — Z01419 Encounter for gynecological examination (general) (routine) without abnormal findings: Secondary | ICD-10-CM | POA: Diagnosis not present

## 2021-05-25 DIAGNOSIS — E282 Polycystic ovarian syndrome: Secondary | ICD-10-CM | POA: Diagnosis not present

## 2021-05-25 DIAGNOSIS — Z6841 Body Mass Index (BMI) 40.0 and over, adult: Secondary | ICD-10-CM | POA: Diagnosis not present

## 2021-05-25 DIAGNOSIS — L659 Nonscarring hair loss, unspecified: Secondary | ICD-10-CM | POA: Diagnosis not present

## 2021-06-08 DIAGNOSIS — F330 Major depressive disorder, recurrent, mild: Secondary | ICD-10-CM | POA: Diagnosis not present

## 2021-06-21 ENCOUNTER — Other Ambulatory Visit: Payer: Self-pay | Admitting: Registered Nurse

## 2021-06-21 DIAGNOSIS — E790 Hyperuricemia without signs of inflammatory arthritis and tophaceous disease: Secondary | ICD-10-CM

## 2021-07-01 DIAGNOSIS — Z411 Encounter for cosmetic surgery: Secondary | ICD-10-CM | POA: Diagnosis not present

## 2021-07-01 DIAGNOSIS — Z5181 Encounter for therapeutic drug level monitoring: Secondary | ICD-10-CM | POA: Diagnosis not present

## 2021-07-01 DIAGNOSIS — L649 Androgenic alopecia, unspecified: Secondary | ICD-10-CM | POA: Diagnosis not present

## 2021-08-01 DIAGNOSIS — Z5181 Encounter for therapeutic drug level monitoring: Secondary | ICD-10-CM | POA: Diagnosis not present

## 2021-09-19 ENCOUNTER — Other Ambulatory Visit: Payer: Self-pay | Admitting: Registered Nurse

## 2021-09-19 DIAGNOSIS — E790 Hyperuricemia without signs of inflammatory arthritis and tophaceous disease: Secondary | ICD-10-CM

## 2021-10-21 DIAGNOSIS — H52203 Unspecified astigmatism, bilateral: Secondary | ICD-10-CM | POA: Diagnosis not present

## 2021-10-21 DIAGNOSIS — H04123 Dry eye syndrome of bilateral lacrimal glands: Secondary | ICD-10-CM | POA: Diagnosis not present

## 2021-10-21 DIAGNOSIS — H5213 Myopia, bilateral: Secondary | ICD-10-CM | POA: Diagnosis not present

## 2021-10-31 DIAGNOSIS — D224 Melanocytic nevi of scalp and neck: Secondary | ICD-10-CM | POA: Diagnosis not present

## 2021-10-31 DIAGNOSIS — L578 Other skin changes due to chronic exposure to nonionizing radiation: Secondary | ICD-10-CM | POA: Diagnosis not present

## 2021-10-31 DIAGNOSIS — L710 Perioral dermatitis: Secondary | ICD-10-CM | POA: Diagnosis not present

## 2021-10-31 DIAGNOSIS — L649 Androgenic alopecia, unspecified: Secondary | ICD-10-CM | POA: Diagnosis not present

## 2021-11-02 DIAGNOSIS — F330 Major depressive disorder, recurrent, mild: Secondary | ICD-10-CM | POA: Diagnosis not present

## 2021-11-07 ENCOUNTER — Encounter: Payer: Self-pay | Admitting: Registered Nurse

## 2021-11-17 ENCOUNTER — Other Ambulatory Visit: Payer: Self-pay | Admitting: Registered Nurse

## 2021-11-17 DIAGNOSIS — R7989 Other specified abnormal findings of blood chemistry: Secondary | ICD-10-CM

## 2021-11-25 NOTE — Telephone Encounter (Signed)
Do you happen to know another office she could be seen sooner? I am happy to change the location but unsure if you may have info on waits

## 2021-11-25 NOTE — Telephone Encounter (Signed)
Pt looking for soonest appt okay with whatever office can accommodate this request

## 2021-12-19 DIAGNOSIS — F330 Major depressive disorder, recurrent, mild: Secondary | ICD-10-CM | POA: Diagnosis not present

## 2021-12-28 DIAGNOSIS — L239 Allergic contact dermatitis, unspecified cause: Secondary | ICD-10-CM | POA: Diagnosis not present

## 2022-02-15 ENCOUNTER — Telehealth: Payer: Self-pay | Admitting: Registered Nurse

## 2022-02-15 NOTE — Telephone Encounter (Signed)
Pt called; was previously referred to [facility], but pt is having a hard time getting an appointment with them.  Pt would like to be referred to [facility] (Dr. Talmage Nap) [phone] instead.

## 2022-02-16 ENCOUNTER — Other Ambulatory Visit: Payer: Self-pay | Admitting: Family

## 2022-02-16 DIAGNOSIS — R7989 Other specified abnormal findings of blood chemistry: Secondary | ICD-10-CM

## 2022-02-16 DIAGNOSIS — E282 Polycystic ovarian syndrome: Secondary | ICD-10-CM

## 2022-02-16 NOTE — Telephone Encounter (Signed)
Spoke with pt; advised that referral had been sent to [facility] for endricrinology, but still may take some time to get appointment.

## 2022-02-23 DIAGNOSIS — F330 Major depressive disorder, recurrent, mild: Secondary | ICD-10-CM | POA: Diagnosis not present

## 2022-03-03 DIAGNOSIS — L649 Androgenic alopecia, unspecified: Secondary | ICD-10-CM | POA: Diagnosis not present

## 2022-03-06 ENCOUNTER — Encounter: Payer: BC Managed Care – PPO | Admitting: Registered Nurse

## 2022-03-07 ENCOUNTER — Encounter: Payer: BC Managed Care – PPO | Admitting: Registered Nurse

## 2022-03-10 DIAGNOSIS — E282 Polycystic ovarian syndrome: Secondary | ICD-10-CM | POA: Diagnosis not present

## 2022-03-10 DIAGNOSIS — R61 Generalized hyperhidrosis: Secondary | ICD-10-CM | POA: Diagnosis not present

## 2022-03-13 DIAGNOSIS — E282 Polycystic ovarian syndrome: Secondary | ICD-10-CM | POA: Diagnosis not present

## 2022-03-17 DIAGNOSIS — R030 Elevated blood-pressure reading, without diagnosis of hypertension: Secondary | ICD-10-CM | POA: Diagnosis not present

## 2022-03-17 DIAGNOSIS — E282 Polycystic ovarian syndrome: Secondary | ICD-10-CM | POA: Diagnosis not present

## 2022-04-03 DIAGNOSIS — F330 Major depressive disorder, recurrent, mild: Secondary | ICD-10-CM | POA: Diagnosis not present

## 2022-04-20 DIAGNOSIS — F330 Major depressive disorder, recurrent, mild: Secondary | ICD-10-CM | POA: Diagnosis not present

## 2022-04-24 DIAGNOSIS — Z0000 Encounter for general adult medical examination without abnormal findings: Secondary | ICD-10-CM | POA: Diagnosis not present

## 2022-05-01 DIAGNOSIS — Z0000 Encounter for general adult medical examination without abnormal findings: Secondary | ICD-10-CM | POA: Diagnosis not present

## 2022-05-01 DIAGNOSIS — R030 Elevated blood-pressure reading, without diagnosis of hypertension: Secondary | ICD-10-CM | POA: Diagnosis not present

## 2022-05-01 DIAGNOSIS — N1831 Chronic kidney disease, stage 3a: Secondary | ICD-10-CM | POA: Diagnosis not present

## 2022-05-22 DIAGNOSIS — N1831 Chronic kidney disease, stage 3a: Secondary | ICD-10-CM | POA: Diagnosis not present

## 2022-05-22 DIAGNOSIS — R030 Elevated blood-pressure reading, without diagnosis of hypertension: Secondary | ICD-10-CM | POA: Diagnosis not present

## 2022-05-22 DIAGNOSIS — Z0000 Encounter for general adult medical examination without abnormal findings: Secondary | ICD-10-CM | POA: Diagnosis not present

## 2022-05-22 DIAGNOSIS — E88819 Insulin resistance, unspecified: Secondary | ICD-10-CM | POA: Diagnosis not present

## 2022-05-22 DIAGNOSIS — E282 Polycystic ovarian syndrome: Secondary | ICD-10-CM | POA: Diagnosis not present

## 2022-05-24 DIAGNOSIS — Z124 Encounter for screening for malignant neoplasm of cervix: Secondary | ICD-10-CM | POA: Diagnosis not present

## 2022-05-24 DIAGNOSIS — Z01419 Encounter for gynecological examination (general) (routine) without abnormal findings: Secondary | ICD-10-CM | POA: Diagnosis not present

## 2022-05-24 DIAGNOSIS — Z6841 Body Mass Index (BMI) 40.0 and over, adult: Secondary | ICD-10-CM | POA: Diagnosis not present

## 2022-05-26 DIAGNOSIS — F330 Major depressive disorder, recurrent, mild: Secondary | ICD-10-CM | POA: Diagnosis not present

## 2022-05-29 DIAGNOSIS — I10 Essential (primary) hypertension: Secondary | ICD-10-CM | POA: Diagnosis not present

## 2022-06-01 DIAGNOSIS — F330 Major depressive disorder, recurrent, mild: Secondary | ICD-10-CM | POA: Diagnosis not present

## 2022-07-11 DIAGNOSIS — I10 Essential (primary) hypertension: Secondary | ICD-10-CM | POA: Diagnosis not present

## 2022-07-11 DIAGNOSIS — N1831 Chronic kidney disease, stage 3a: Secondary | ICD-10-CM | POA: Diagnosis not present

## 2022-08-01 DIAGNOSIS — F330 Major depressive disorder, recurrent, mild: Secondary | ICD-10-CM | POA: Diagnosis not present

## 2022-08-11 DIAGNOSIS — K140 Glossitis: Secondary | ICD-10-CM | POA: Diagnosis not present

## 2022-08-31 DIAGNOSIS — F330 Major depressive disorder, recurrent, mild: Secondary | ICD-10-CM | POA: Diagnosis not present

## 2022-09-01 DIAGNOSIS — L649 Androgenic alopecia, unspecified: Secondary | ICD-10-CM | POA: Diagnosis not present

## 2022-09-01 DIAGNOSIS — Z5181 Encounter for therapeutic drug level monitoring: Secondary | ICD-10-CM | POA: Diagnosis not present

## 2022-09-06 DIAGNOSIS — F330 Major depressive disorder, recurrent, mild: Secondary | ICD-10-CM | POA: Diagnosis not present

## 2022-09-08 DIAGNOSIS — E282 Polycystic ovarian syndrome: Secondary | ICD-10-CM | POA: Diagnosis not present

## 2022-09-11 DIAGNOSIS — E038 Other specified hypothyroidism: Secondary | ICD-10-CM | POA: Diagnosis not present

## 2022-09-11 DIAGNOSIS — E8881 Metabolic syndrome: Secondary | ICD-10-CM | POA: Diagnosis not present

## 2022-09-11 DIAGNOSIS — I10 Essential (primary) hypertension: Secondary | ICD-10-CM | POA: Diagnosis not present

## 2022-09-11 DIAGNOSIS — E282 Polycystic ovarian syndrome: Secondary | ICD-10-CM | POA: Diagnosis not present

## 2022-10-30 DIAGNOSIS — F330 Major depressive disorder, recurrent, mild: Secondary | ICD-10-CM | POA: Diagnosis not present

## 2022-11-13 DIAGNOSIS — Z5181 Encounter for therapeutic drug level monitoring: Secondary | ICD-10-CM | POA: Diagnosis not present

## 2022-11-13 DIAGNOSIS — L649 Androgenic alopecia, unspecified: Secondary | ICD-10-CM | POA: Diagnosis not present

## 2022-11-30 DIAGNOSIS — I10 Essential (primary) hypertension: Secondary | ICD-10-CM | POA: Diagnosis not present

## 2022-11-30 DIAGNOSIS — N1831 Chronic kidney disease, stage 3a: Secondary | ICD-10-CM | POA: Diagnosis not present

## 2022-12-07 DIAGNOSIS — E038 Other specified hypothyroidism: Secondary | ICD-10-CM | POA: Diagnosis not present

## 2022-12-07 DIAGNOSIS — E282 Polycystic ovarian syndrome: Secondary | ICD-10-CM | POA: Diagnosis not present

## 2022-12-07 DIAGNOSIS — E8881 Metabolic syndrome: Secondary | ICD-10-CM | POA: Diagnosis not present

## 2022-12-11 DIAGNOSIS — L814 Other melanin hyperpigmentation: Secondary | ICD-10-CM | POA: Diagnosis not present

## 2022-12-11 DIAGNOSIS — D224 Melanocytic nevi of scalp and neck: Secondary | ICD-10-CM | POA: Diagnosis not present

## 2022-12-11 DIAGNOSIS — L578 Other skin changes due to chronic exposure to nonionizing radiation: Secondary | ICD-10-CM | POA: Diagnosis not present

## 2022-12-25 DIAGNOSIS — F330 Major depressive disorder, recurrent, mild: Secondary | ICD-10-CM | POA: Diagnosis not present

## 2022-12-25 DIAGNOSIS — N1831 Chronic kidney disease, stage 3a: Secondary | ICD-10-CM | POA: Diagnosis not present

## 2022-12-25 DIAGNOSIS — E282 Polycystic ovarian syndrome: Secondary | ICD-10-CM | POA: Diagnosis not present

## 2023-01-01 DIAGNOSIS — N182 Chronic kidney disease, stage 2 (mild): Secondary | ICD-10-CM | POA: Diagnosis not present

## 2023-01-01 DIAGNOSIS — E282 Polycystic ovarian syndrome: Secondary | ICD-10-CM | POA: Diagnosis not present

## 2023-01-01 DIAGNOSIS — I10 Essential (primary) hypertension: Secondary | ICD-10-CM | POA: Diagnosis not present

## 2023-01-01 DIAGNOSIS — E038 Other specified hypothyroidism: Secondary | ICD-10-CM | POA: Diagnosis not present

## 2023-01-16 DIAGNOSIS — H5213 Myopia, bilateral: Secondary | ICD-10-CM | POA: Diagnosis not present

## 2023-01-16 DIAGNOSIS — H04123 Dry eye syndrome of bilateral lacrimal glands: Secondary | ICD-10-CM | POA: Diagnosis not present

## 2023-01-16 DIAGNOSIS — R61 Generalized hyperhidrosis: Secondary | ICD-10-CM | POA: Diagnosis not present

## 2023-01-19 DIAGNOSIS — R61 Generalized hyperhidrosis: Secondary | ICD-10-CM | POA: Diagnosis not present

## 2023-02-16 ENCOUNTER — Other Ambulatory Visit: Payer: Self-pay | Admitting: Internal Medicine

## 2023-02-16 DIAGNOSIS — R61 Generalized hyperhidrosis: Secondary | ICD-10-CM

## 2023-02-20 ENCOUNTER — Ambulatory Visit
Admission: RE | Admit: 2023-02-20 | Discharge: 2023-02-20 | Disposition: A | Discharge status: Home / Self Care | Payer: BC Managed Care – PPO | Source: Ambulatory Visit | Attending: Internal Medicine | Admitting: Internal Medicine

## 2023-02-20 DIAGNOSIS — R61 Generalized hyperhidrosis: Secondary | ICD-10-CM | POA: Diagnosis not present

## 2023-02-20 MED ORDER — IOPAMIDOL (ISOVUE-300) INJECTION 61%
100.0000 mL | Freq: Once | INTRAVENOUS | Status: AC | PRN
  Administered 2023-02-20: 100 mL via INTRAVENOUS

## 2023-02-27 DIAGNOSIS — F3341 Major depressive disorder, recurrent, in partial remission: Secondary | ICD-10-CM | POA: Diagnosis not present

## 2023-03-02 DIAGNOSIS — F3341 Major depressive disorder, recurrent, in partial remission: Secondary | ICD-10-CM | POA: Diagnosis not present

## 2023-03-05 DIAGNOSIS — R61 Generalized hyperhidrosis: Secondary | ICD-10-CM | POA: Diagnosis not present

## 2023-03-05 DIAGNOSIS — E282 Polycystic ovarian syndrome: Secondary | ICD-10-CM | POA: Diagnosis not present

## 2023-03-28 DIAGNOSIS — Z23 Encounter for immunization: Secondary | ICD-10-CM | POA: Diagnosis not present

## 2023-03-28 DIAGNOSIS — R61 Generalized hyperhidrosis: Secondary | ICD-10-CM | POA: Diagnosis not present

## 2023-03-28 DIAGNOSIS — E282 Polycystic ovarian syndrome: Secondary | ICD-10-CM | POA: Diagnosis not present

## 2023-04-06 DIAGNOSIS — F3341 Major depressive disorder, recurrent, in partial remission: Secondary | ICD-10-CM | POA: Diagnosis not present

## 2023-04-26 ENCOUNTER — Encounter (INDEPENDENT_AMBULATORY_CARE_PROVIDER_SITE_OTHER): Payer: Self-pay

## 2023-05-01 DIAGNOSIS — K130 Diseases of lips: Secondary | ICD-10-CM | POA: Diagnosis not present

## 2023-05-01 DIAGNOSIS — L649 Androgenic alopecia, unspecified: Secondary | ICD-10-CM | POA: Diagnosis not present

## 2023-05-07 DIAGNOSIS — N182 Chronic kidney disease, stage 2 (mild): Secondary | ICD-10-CM | POA: Diagnosis not present

## 2023-05-07 DIAGNOSIS — E282 Polycystic ovarian syndrome: Secondary | ICD-10-CM | POA: Diagnosis not present

## 2023-05-07 DIAGNOSIS — I10 Essential (primary) hypertension: Secondary | ICD-10-CM | POA: Diagnosis not present

## 2023-05-07 DIAGNOSIS — E038 Other specified hypothyroidism: Secondary | ICD-10-CM | POA: Diagnosis not present

## 2023-05-11 DIAGNOSIS — F3341 Major depressive disorder, recurrent, in partial remission: Secondary | ICD-10-CM | POA: Diagnosis not present

## 2023-05-14 DIAGNOSIS — Z0000 Encounter for general adult medical examination without abnormal findings: Secondary | ICD-10-CM | POA: Diagnosis not present

## 2023-05-14 DIAGNOSIS — I10 Essential (primary) hypertension: Secondary | ICD-10-CM | POA: Diagnosis not present

## 2023-05-14 DIAGNOSIS — N182 Chronic kidney disease, stage 2 (mild): Secondary | ICD-10-CM | POA: Diagnosis not present

## 2023-06-05 DIAGNOSIS — Z1339 Encounter for screening examination for other mental health and behavioral disorders: Secondary | ICD-10-CM | POA: Diagnosis not present

## 2023-06-05 DIAGNOSIS — Z01419 Encounter for gynecological examination (general) (routine) without abnormal findings: Secondary | ICD-10-CM | POA: Diagnosis not present

## 2023-06-08 DIAGNOSIS — E282 Polycystic ovarian syndrome: Secondary | ICD-10-CM | POA: Diagnosis not present
